# Patient Record
Sex: Male | Born: 1945 | Race: White | Hispanic: No | Marital: Married | Smoking: Former smoker
Health system: Southern US, Community
[De-identification: ages and names within clinical notes are randomized; demographics above are authoritative.]

## PROBLEM LIST (undated history)

## (undated) DIAGNOSIS — I700 Atherosclerosis of aorta: Secondary | ICD-10-CM

## (undated) DIAGNOSIS — Z9889 Other specified postprocedural states: Secondary | ICD-10-CM

## (undated) DIAGNOSIS — G4733 Obstructive sleep apnea (adult) (pediatric): Secondary | ICD-10-CM

## (undated) DIAGNOSIS — E785 Hyperlipidemia, unspecified: Secondary | ICD-10-CM

## (undated) HISTORY — PX: REPLACEMENT TOTAL KNEE: SUR1224

---

## 2011-02-19 ENCOUNTER — Encounter (HOSPITAL_COMMUNITY)
Admission: RE | Admit: 2011-02-19 | Discharge: 2011-02-19 | Disposition: A | Discharge status: Home / Self Care | Payer: Medicare Other | Source: Ambulatory Visit | Attending: Neurosurgery | Admitting: Neurosurgery

## 2011-02-19 DIAGNOSIS — Z01818 Encounter for other preprocedural examination: Secondary | ICD-10-CM | POA: Insufficient documentation

## 2011-02-19 LAB — CBC
Hemoglobin: 14.2 g/dL (ref 13.0–17.0)
MCHC: 34.6 g/dL (ref 30.0–36.0)
RDW: 12.8 % (ref 11.5–15.5)

## 2011-02-19 LAB — SURGICAL PCR SCREEN
MRSA, PCR: NEGATIVE
Staphylococcus aureus: NEGATIVE

## 2011-02-25 ENCOUNTER — Inpatient Hospital Stay (HOSPITAL_COMMUNITY): Admission: RE | Admit: 2011-02-25 | Payer: Medicare Other | Source: Ambulatory Visit | Admitting: Neurosurgery

## 2013-12-06 ENCOUNTER — Ambulatory Visit: Payer: Self-pay | Admitting: Podiatrist

## 2016-10-31 DIAGNOSIS — Z4801 Encounter for change or removal of surgical wound dressing: Secondary | ICD-10-CM | POA: Diagnosis not present

## 2016-10-31 DIAGNOSIS — I83813 Varicose veins of bilateral lower extremities with pain: Secondary | ICD-10-CM | POA: Diagnosis not present

## 2016-10-31 DIAGNOSIS — I83893 Varicose veins of bilateral lower extremities with other complications: Secondary | ICD-10-CM | POA: Diagnosis not present

## 2016-11-14 DIAGNOSIS — I8392 Asymptomatic varicose veins of left lower extremity: Secondary | ICD-10-CM | POA: Diagnosis not present

## 2016-11-14 DIAGNOSIS — I83813 Varicose veins of bilateral lower extremities with pain: Secondary | ICD-10-CM | POA: Diagnosis not present

## 2016-11-14 DIAGNOSIS — Z48812 Encounter for surgical aftercare following surgery on the circulatory system: Secondary | ICD-10-CM | POA: Diagnosis not present

## 2016-11-17 DIAGNOSIS — M26602 Left temporomandibular joint disorder, unspecified: Secondary | ICD-10-CM | POA: Diagnosis not present

## 2016-12-29 DIAGNOSIS — G4733 Obstructive sleep apnea (adult) (pediatric): Secondary | ICD-10-CM | POA: Diagnosis not present

## 2017-01-13 DIAGNOSIS — H26491 Other secondary cataract, right eye: Secondary | ICD-10-CM | POA: Diagnosis not present

## 2017-01-13 DIAGNOSIS — Z8669 Personal history of other diseases of the nervous system and sense organs: Secondary | ICD-10-CM | POA: Diagnosis not present

## 2017-01-13 DIAGNOSIS — H35363 Drusen (degenerative) of macula, bilateral: Secondary | ICD-10-CM | POA: Diagnosis not present

## 2017-01-13 DIAGNOSIS — H04123 Dry eye syndrome of bilateral lacrimal glands: Secondary | ICD-10-CM | POA: Diagnosis not present

## 2017-01-13 DIAGNOSIS — H2512 Age-related nuclear cataract, left eye: Secondary | ICD-10-CM | POA: Diagnosis not present

## 2017-01-13 DIAGNOSIS — H35362 Drusen (degenerative) of macula, left eye: Secondary | ICD-10-CM | POA: Diagnosis not present

## 2017-01-13 DIAGNOSIS — H43812 Vitreous degeneration, left eye: Secondary | ICD-10-CM | POA: Diagnosis not present

## 2017-01-15 DIAGNOSIS — G4733 Obstructive sleep apnea (adult) (pediatric): Secondary | ICD-10-CM | POA: Diagnosis not present

## 2017-02-13 DIAGNOSIS — Z48812 Encounter for surgical aftercare following surgery on the circulatory system: Secondary | ICD-10-CM | POA: Diagnosis not present

## 2017-02-13 DIAGNOSIS — I83813 Varicose veins of bilateral lower extremities with pain: Secondary | ICD-10-CM | POA: Diagnosis not present

## 2017-03-11 DIAGNOSIS — G4733 Obstructive sleep apnea (adult) (pediatric): Secondary | ICD-10-CM | POA: Diagnosis not present

## 2017-04-08 DIAGNOSIS — Z0000 Encounter for general adult medical examination without abnormal findings: Secondary | ICD-10-CM | POA: Diagnosis not present

## 2017-05-28 DIAGNOSIS — R1013 Epigastric pain: Secondary | ICD-10-CM | POA: Diagnosis not present

## 2017-05-28 DIAGNOSIS — K921 Melena: Secondary | ICD-10-CM | POA: Diagnosis not present

## 2017-05-28 DIAGNOSIS — R12 Heartburn: Secondary | ICD-10-CM | POA: Diagnosis not present

## 2017-05-28 DIAGNOSIS — K5900 Constipation, unspecified: Secondary | ICD-10-CM | POA: Diagnosis not present

## 2017-06-05 DIAGNOSIS — R109 Unspecified abdominal pain: Secondary | ICD-10-CM | POA: Diagnosis not present

## 2017-06-05 DIAGNOSIS — R1013 Epigastric pain: Secondary | ICD-10-CM | POA: Diagnosis not present

## 2017-06-05 DIAGNOSIS — K2950 Unspecified chronic gastritis without bleeding: Secondary | ICD-10-CM | POA: Diagnosis not present

## 2017-06-05 DIAGNOSIS — K921 Melena: Secondary | ICD-10-CM | POA: Diagnosis not present

## 2017-06-05 DIAGNOSIS — K219 Gastro-esophageal reflux disease without esophagitis: Secondary | ICD-10-CM | POA: Diagnosis not present

## 2017-06-05 DIAGNOSIS — K449 Diaphragmatic hernia without obstruction or gangrene: Secondary | ICD-10-CM | POA: Diagnosis not present

## 2017-06-05 DIAGNOSIS — R12 Heartburn: Secondary | ICD-10-CM | POA: Diagnosis not present

## 2017-06-15 DIAGNOSIS — Z85828 Personal history of other malignant neoplasm of skin: Secondary | ICD-10-CM | POA: Diagnosis not present

## 2017-06-15 DIAGNOSIS — Z08 Encounter for follow-up examination after completed treatment for malignant neoplasm: Secondary | ICD-10-CM | POA: Diagnosis not present

## 2017-06-15 DIAGNOSIS — L570 Actinic keratosis: Secondary | ICD-10-CM | POA: Diagnosis not present

## 2017-07-30 DIAGNOSIS — K219 Gastro-esophageal reflux disease without esophagitis: Secondary | ICD-10-CM | POA: Diagnosis not present

## 2017-07-30 DIAGNOSIS — K5900 Constipation, unspecified: Secondary | ICD-10-CM | POA: Diagnosis not present

## 2017-07-31 DIAGNOSIS — Z23 Encounter for immunization: Secondary | ICD-10-CM | POA: Diagnosis not present

## 2017-10-08 DIAGNOSIS — F419 Anxiety disorder, unspecified: Secondary | ICD-10-CM | POA: Diagnosis not present

## 2017-10-08 DIAGNOSIS — K219 Gastro-esophageal reflux disease without esophagitis: Secondary | ICD-10-CM | POA: Diagnosis not present

## 2017-10-08 DIAGNOSIS — E782 Mixed hyperlipidemia: Secondary | ICD-10-CM | POA: Diagnosis not present

## 2017-12-28 DIAGNOSIS — M25511 Pain in right shoulder: Secondary | ICD-10-CM | POA: Diagnosis not present

## 2017-12-29 DIAGNOSIS — Z135 Encounter for screening for eye and ear disorders: Secondary | ICD-10-CM | POA: Diagnosis not present

## 2017-12-29 DIAGNOSIS — M25511 Pain in right shoulder: Secondary | ICD-10-CM | POA: Diagnosis not present

## 2017-12-30 DIAGNOSIS — M25511 Pain in right shoulder: Secondary | ICD-10-CM | POA: Diagnosis not present

## 2017-12-30 DIAGNOSIS — M75101 Unspecified rotator cuff tear or rupture of right shoulder, not specified as traumatic: Secondary | ICD-10-CM | POA: Diagnosis not present

## 2018-01-26 DIAGNOSIS — H43812 Vitreous degeneration, left eye: Secondary | ICD-10-CM | POA: Diagnosis not present

## 2018-01-26 DIAGNOSIS — Z880 Allergy status to penicillin: Secondary | ICD-10-CM | POA: Diagnosis not present

## 2018-01-26 DIAGNOSIS — Z09 Encounter for follow-up examination after completed treatment for conditions other than malignant neoplasm: Secondary | ICD-10-CM | POA: Diagnosis not present

## 2018-01-26 DIAGNOSIS — H04123 Dry eye syndrome of bilateral lacrimal glands: Secondary | ICD-10-CM | POA: Diagnosis not present

## 2018-01-26 DIAGNOSIS — Z8669 Personal history of other diseases of the nervous system and sense organs: Secondary | ICD-10-CM | POA: Diagnosis not present

## 2018-01-26 DIAGNOSIS — H35363 Drusen (degenerative) of macula, bilateral: Secondary | ICD-10-CM | POA: Diagnosis not present

## 2018-01-26 DIAGNOSIS — H2512 Age-related nuclear cataract, left eye: Secondary | ICD-10-CM | POA: Diagnosis not present

## 2018-01-26 DIAGNOSIS — H31001 Unspecified chorioretinal scars, right eye: Secondary | ICD-10-CM | POA: Diagnosis not present

## 2018-01-26 DIAGNOSIS — Z961 Presence of intraocular lens: Secondary | ICD-10-CM | POA: Diagnosis not present

## 2018-02-01 DIAGNOSIS — M6281 Muscle weakness (generalized): Secondary | ICD-10-CM | POA: Diagnosis not present

## 2018-02-01 DIAGNOSIS — M25511 Pain in right shoulder: Secondary | ICD-10-CM | POA: Diagnosis not present

## 2018-02-01 DIAGNOSIS — M75101 Unspecified rotator cuff tear or rupture of right shoulder, not specified as traumatic: Secondary | ICD-10-CM | POA: Diagnosis not present

## 2018-02-19 DIAGNOSIS — M75101 Unspecified rotator cuff tear or rupture of right shoulder, not specified as traumatic: Secondary | ICD-10-CM | POA: Diagnosis not present

## 2018-02-19 DIAGNOSIS — M25511 Pain in right shoulder: Secondary | ICD-10-CM | POA: Diagnosis not present

## 2018-02-19 DIAGNOSIS — M6281 Muscle weakness (generalized): Secondary | ICD-10-CM | POA: Diagnosis not present

## 2018-03-10 DIAGNOSIS — M1711 Unilateral primary osteoarthritis, right knee: Secondary | ICD-10-CM | POA: Diagnosis not present

## 2018-03-18 DIAGNOSIS — M1711 Unilateral primary osteoarthritis, right knee: Secondary | ICD-10-CM | POA: Diagnosis not present

## 2018-04-16 DIAGNOSIS — Z0000 Encounter for general adult medical examination without abnormal findings: Secondary | ICD-10-CM | POA: Diagnosis not present

## 2018-04-19 DIAGNOSIS — I493 Ventricular premature depolarization: Secondary | ICD-10-CM | POA: Diagnosis not present

## 2018-04-19 DIAGNOSIS — R55 Syncope and collapse: Secondary | ICD-10-CM | POA: Diagnosis not present

## 2018-04-20 DIAGNOSIS — R7989 Other specified abnormal findings of blood chemistry: Secondary | ICD-10-CM | POA: Diagnosis not present

## 2018-04-20 DIAGNOSIS — R55 Syncope and collapse: Secondary | ICD-10-CM | POA: Diagnosis not present

## 2018-04-21 DIAGNOSIS — G4733 Obstructive sleep apnea (adult) (pediatric): Secondary | ICD-10-CM | POA: Diagnosis not present

## 2018-04-22 DIAGNOSIS — R55 Syncope and collapse: Secondary | ICD-10-CM | POA: Diagnosis not present

## 2018-04-27 DIAGNOSIS — R55 Syncope and collapse: Secondary | ICD-10-CM | POA: Diagnosis not present

## 2018-05-21 DIAGNOSIS — R55 Syncope and collapse: Secondary | ICD-10-CM | POA: Diagnosis not present

## 2018-06-18 DIAGNOSIS — L578 Other skin changes due to chronic exposure to nonionizing radiation: Secondary | ICD-10-CM | POA: Diagnosis not present

## 2018-06-18 DIAGNOSIS — D485 Neoplasm of uncertain behavior of skin: Secondary | ICD-10-CM | POA: Diagnosis not present

## 2018-06-18 DIAGNOSIS — L738 Other specified follicular disorders: Secondary | ICD-10-CM | POA: Diagnosis not present

## 2018-06-18 DIAGNOSIS — L821 Other seborrheic keratosis: Secondary | ICD-10-CM | POA: Diagnosis not present

## 2018-06-18 DIAGNOSIS — L570 Actinic keratosis: Secondary | ICD-10-CM | POA: Diagnosis not present

## 2018-06-18 DIAGNOSIS — Z08 Encounter for follow-up examination after completed treatment for malignant neoplasm: Secondary | ICD-10-CM | POA: Diagnosis not present

## 2018-06-18 DIAGNOSIS — Z85828 Personal history of other malignant neoplasm of skin: Secondary | ICD-10-CM | POA: Diagnosis not present

## 2018-06-18 DIAGNOSIS — C44719 Basal cell carcinoma of skin of left lower limb, including hip: Secondary | ICD-10-CM | POA: Diagnosis not present

## 2018-06-21 DIAGNOSIS — M1711 Unilateral primary osteoarthritis, right knee: Secondary | ICD-10-CM | POA: Diagnosis not present

## 2018-06-21 DIAGNOSIS — M75101 Unspecified rotator cuff tear or rupture of right shoulder, not specified as traumatic: Secondary | ICD-10-CM | POA: Diagnosis not present

## 2018-07-04 DIAGNOSIS — Z23 Encounter for immunization: Secondary | ICD-10-CM | POA: Diagnosis not present

## 2018-07-05 DIAGNOSIS — R55 Syncope and collapse: Secondary | ICD-10-CM | POA: Diagnosis not present

## 2018-08-03 DIAGNOSIS — C44719 Basal cell carcinoma of skin of left lower limb, including hip: Secondary | ICD-10-CM | POA: Diagnosis not present

## 2018-08-18 DIAGNOSIS — F419 Anxiety disorder, unspecified: Secondary | ICD-10-CM | POA: Diagnosis not present

## 2018-08-18 DIAGNOSIS — R972 Elevated prostate specific antigen [PSA]: Secondary | ICD-10-CM | POA: Diagnosis not present

## 2018-08-18 DIAGNOSIS — M75101 Unspecified rotator cuff tear or rupture of right shoulder, not specified as traumatic: Secondary | ICD-10-CM | POA: Diagnosis not present

## 2018-08-18 DIAGNOSIS — G4733 Obstructive sleep apnea (adult) (pediatric): Secondary | ICD-10-CM | POA: Diagnosis not present

## 2018-08-23 DIAGNOSIS — M75101 Unspecified rotator cuff tear or rupture of right shoulder, not specified as traumatic: Secondary | ICD-10-CM | POA: Diagnosis not present

## 2018-08-25 DIAGNOSIS — M75121 Complete rotator cuff tear or rupture of right shoulder, not specified as traumatic: Secondary | ICD-10-CM | POA: Diagnosis not present

## 2018-08-25 DIAGNOSIS — M25511 Pain in right shoulder: Secondary | ICD-10-CM | POA: Diagnosis not present

## 2018-08-26 DIAGNOSIS — M75101 Unspecified rotator cuff tear or rupture of right shoulder, not specified as traumatic: Secondary | ICD-10-CM | POA: Diagnosis not present

## 2018-08-31 DIAGNOSIS — M75101 Unspecified rotator cuff tear or rupture of right shoulder, not specified as traumatic: Secondary | ICD-10-CM | POA: Diagnosis not present

## 2018-09-01 DIAGNOSIS — Z1331 Encounter for screening for depression: Secondary | ICD-10-CM | POA: Diagnosis not present

## 2018-09-01 DIAGNOSIS — Z01818 Encounter for other preprocedural examination: Secondary | ICD-10-CM | POA: Diagnosis not present

## 2018-09-01 DIAGNOSIS — F419 Anxiety disorder, unspecified: Secondary | ICD-10-CM | POA: Diagnosis not present

## 2018-09-01 DIAGNOSIS — Z139 Encounter for screening, unspecified: Secondary | ICD-10-CM | POA: Diagnosis not present

## 2018-09-01 DIAGNOSIS — Z0000 Encounter for general adult medical examination without abnormal findings: Secondary | ICD-10-CM | POA: Diagnosis not present

## 2018-09-09 DIAGNOSIS — Z6826 Body mass index (BMI) 26.0-26.9, adult: Secondary | ICD-10-CM | POA: Diagnosis not present

## 2018-09-09 DIAGNOSIS — Z01818 Encounter for other preprocedural examination: Secondary | ICD-10-CM | POA: Diagnosis not present

## 2018-09-10 DIAGNOSIS — M67813 Other specified disorders of tendon, right shoulder: Secondary | ICD-10-CM | POA: Diagnosis not present

## 2018-09-10 DIAGNOSIS — K219 Gastro-esophageal reflux disease without esophagitis: Secondary | ICD-10-CM | POA: Diagnosis not present

## 2018-09-10 DIAGNOSIS — M24611 Ankylosis, right shoulder: Secondary | ICD-10-CM | POA: Diagnosis not present

## 2018-09-10 DIAGNOSIS — M75101 Unspecified rotator cuff tear or rupture of right shoulder, not specified as traumatic: Secondary | ICD-10-CM | POA: Diagnosis not present

## 2018-09-10 DIAGNOSIS — M7551 Bursitis of right shoulder: Secondary | ICD-10-CM | POA: Diagnosis not present

## 2018-09-10 DIAGNOSIS — M67921 Unspecified disorder of synovium and tendon, right upper arm: Secondary | ICD-10-CM | POA: Diagnosis not present

## 2018-09-10 DIAGNOSIS — E7800 Pure hypercholesterolemia, unspecified: Secondary | ICD-10-CM | POA: Diagnosis not present

## 2018-09-10 DIAGNOSIS — G8918 Other acute postprocedural pain: Secondary | ICD-10-CM | POA: Diagnosis not present

## 2018-09-10 DIAGNOSIS — Z87891 Personal history of nicotine dependence: Secondary | ICD-10-CM | POA: Diagnosis not present

## 2018-09-10 DIAGNOSIS — M1711 Unilateral primary osteoarthritis, right knee: Secondary | ICD-10-CM | POA: Diagnosis not present

## 2018-09-10 DIAGNOSIS — Z79899 Other long term (current) drug therapy: Secondary | ICD-10-CM | POA: Diagnosis not present

## 2018-09-10 DIAGNOSIS — M75121 Complete rotator cuff tear or rupture of right shoulder, not specified as traumatic: Secondary | ICD-10-CM | POA: Diagnosis not present

## 2018-09-10 DIAGNOSIS — M75111 Incomplete rotator cuff tear or rupture of right shoulder, not specified as traumatic: Secondary | ICD-10-CM | POA: Diagnosis not present

## 2018-10-15 DIAGNOSIS — Z4789 Encounter for other orthopedic aftercare: Secondary | ICD-10-CM | POA: Diagnosis not present

## 2018-10-15 DIAGNOSIS — M6281 Muscle weakness (generalized): Secondary | ICD-10-CM | POA: Diagnosis not present

## 2018-10-15 DIAGNOSIS — M25611 Stiffness of right shoulder, not elsewhere classified: Secondary | ICD-10-CM | POA: Diagnosis not present

## 2018-10-18 DIAGNOSIS — M25611 Stiffness of right shoulder, not elsewhere classified: Secondary | ICD-10-CM | POA: Diagnosis not present

## 2018-10-18 DIAGNOSIS — M6281 Muscle weakness (generalized): Secondary | ICD-10-CM | POA: Diagnosis not present

## 2018-10-18 DIAGNOSIS — Z4789 Encounter for other orthopedic aftercare: Secondary | ICD-10-CM | POA: Diagnosis not present

## 2018-10-19 DIAGNOSIS — M6281 Muscle weakness (generalized): Secondary | ICD-10-CM | POA: Diagnosis not present

## 2018-10-19 DIAGNOSIS — Z4789 Encounter for other orthopedic aftercare: Secondary | ICD-10-CM | POA: Diagnosis not present

## 2018-10-19 DIAGNOSIS — M25611 Stiffness of right shoulder, not elsewhere classified: Secondary | ICD-10-CM | POA: Diagnosis not present

## 2018-10-22 DIAGNOSIS — Z4789 Encounter for other orthopedic aftercare: Secondary | ICD-10-CM | POA: Diagnosis not present

## 2018-10-22 DIAGNOSIS — M25611 Stiffness of right shoulder, not elsewhere classified: Secondary | ICD-10-CM | POA: Diagnosis not present

## 2018-10-22 DIAGNOSIS — M6281 Muscle weakness (generalized): Secondary | ICD-10-CM | POA: Diagnosis not present

## 2018-10-25 DIAGNOSIS — Z4789 Encounter for other orthopedic aftercare: Secondary | ICD-10-CM | POA: Diagnosis not present

## 2018-10-25 DIAGNOSIS — M25611 Stiffness of right shoulder, not elsewhere classified: Secondary | ICD-10-CM | POA: Diagnosis not present

## 2018-10-25 DIAGNOSIS — M6281 Muscle weakness (generalized): Secondary | ICD-10-CM | POA: Diagnosis not present

## 2018-10-28 DIAGNOSIS — Z4789 Encounter for other orthopedic aftercare: Secondary | ICD-10-CM | POA: Diagnosis not present

## 2018-10-28 DIAGNOSIS — M6281 Muscle weakness (generalized): Secondary | ICD-10-CM | POA: Diagnosis not present

## 2018-10-28 DIAGNOSIS — M25611 Stiffness of right shoulder, not elsewhere classified: Secondary | ICD-10-CM | POA: Diagnosis not present

## 2018-11-02 DIAGNOSIS — Z4789 Encounter for other orthopedic aftercare: Secondary | ICD-10-CM | POA: Diagnosis not present

## 2018-11-02 DIAGNOSIS — M6281 Muscle weakness (generalized): Secondary | ICD-10-CM | POA: Diagnosis not present

## 2018-11-02 DIAGNOSIS — M25611 Stiffness of right shoulder, not elsewhere classified: Secondary | ICD-10-CM | POA: Diagnosis not present

## 2018-11-05 DIAGNOSIS — M25611 Stiffness of right shoulder, not elsewhere classified: Secondary | ICD-10-CM | POA: Diagnosis not present

## 2018-11-05 DIAGNOSIS — M6281 Muscle weakness (generalized): Secondary | ICD-10-CM | POA: Diagnosis not present

## 2018-11-05 DIAGNOSIS — Z4789 Encounter for other orthopedic aftercare: Secondary | ICD-10-CM | POA: Diagnosis not present

## 2018-11-09 DIAGNOSIS — M6281 Muscle weakness (generalized): Secondary | ICD-10-CM | POA: Diagnosis not present

## 2018-11-09 DIAGNOSIS — Z4789 Encounter for other orthopedic aftercare: Secondary | ICD-10-CM | POA: Diagnosis not present

## 2018-11-09 DIAGNOSIS — M25611 Stiffness of right shoulder, not elsewhere classified: Secondary | ICD-10-CM | POA: Diagnosis not present

## 2018-11-11 DIAGNOSIS — Z4789 Encounter for other orthopedic aftercare: Secondary | ICD-10-CM | POA: Diagnosis not present

## 2018-11-11 DIAGNOSIS — M6281 Muscle weakness (generalized): Secondary | ICD-10-CM | POA: Diagnosis not present

## 2018-11-11 DIAGNOSIS — M25611 Stiffness of right shoulder, not elsewhere classified: Secondary | ICD-10-CM | POA: Diagnosis not present

## 2018-11-16 DIAGNOSIS — M25611 Stiffness of right shoulder, not elsewhere classified: Secondary | ICD-10-CM | POA: Diagnosis not present

## 2018-11-16 DIAGNOSIS — Z4789 Encounter for other orthopedic aftercare: Secondary | ICD-10-CM | POA: Diagnosis not present

## 2018-11-16 DIAGNOSIS — M6281 Muscle weakness (generalized): Secondary | ICD-10-CM | POA: Diagnosis not present

## 2018-11-19 DIAGNOSIS — Z4789 Encounter for other orthopedic aftercare: Secondary | ICD-10-CM | POA: Diagnosis not present

## 2018-11-19 DIAGNOSIS — M6281 Muscle weakness (generalized): Secondary | ICD-10-CM | POA: Diagnosis not present

## 2018-11-19 DIAGNOSIS — M25611 Stiffness of right shoulder, not elsewhere classified: Secondary | ICD-10-CM | POA: Diagnosis not present

## 2018-11-22 DIAGNOSIS — Z4789 Encounter for other orthopedic aftercare: Secondary | ICD-10-CM | POA: Diagnosis not present

## 2018-11-22 DIAGNOSIS — M6281 Muscle weakness (generalized): Secondary | ICD-10-CM | POA: Diagnosis not present

## 2018-11-22 DIAGNOSIS — M25611 Stiffness of right shoulder, not elsewhere classified: Secondary | ICD-10-CM | POA: Diagnosis not present

## 2018-11-24 DIAGNOSIS — M25611 Stiffness of right shoulder, not elsewhere classified: Secondary | ICD-10-CM | POA: Diagnosis not present

## 2018-11-24 DIAGNOSIS — Z4789 Encounter for other orthopedic aftercare: Secondary | ICD-10-CM | POA: Diagnosis not present

## 2018-11-24 DIAGNOSIS — M6281 Muscle weakness (generalized): Secondary | ICD-10-CM | POA: Diagnosis not present

## 2018-11-30 DIAGNOSIS — M6281 Muscle weakness (generalized): Secondary | ICD-10-CM | POA: Diagnosis not present

## 2018-11-30 DIAGNOSIS — M25611 Stiffness of right shoulder, not elsewhere classified: Secondary | ICD-10-CM | POA: Diagnosis not present

## 2018-11-30 DIAGNOSIS — Z4789 Encounter for other orthopedic aftercare: Secondary | ICD-10-CM | POA: Diagnosis not present

## 2018-12-03 DIAGNOSIS — M25611 Stiffness of right shoulder, not elsewhere classified: Secondary | ICD-10-CM | POA: Diagnosis not present

## 2018-12-03 DIAGNOSIS — Z4789 Encounter for other orthopedic aftercare: Secondary | ICD-10-CM | POA: Diagnosis not present

## 2018-12-03 DIAGNOSIS — M6281 Muscle weakness (generalized): Secondary | ICD-10-CM | POA: Diagnosis not present

## 2018-12-08 DIAGNOSIS — M25611 Stiffness of right shoulder, not elsewhere classified: Secondary | ICD-10-CM | POA: Diagnosis not present

## 2018-12-08 DIAGNOSIS — Z4789 Encounter for other orthopedic aftercare: Secondary | ICD-10-CM | POA: Diagnosis not present

## 2018-12-08 DIAGNOSIS — M6281 Muscle weakness (generalized): Secondary | ICD-10-CM | POA: Diagnosis not present

## 2018-12-15 DIAGNOSIS — Z4789 Encounter for other orthopedic aftercare: Secondary | ICD-10-CM | POA: Diagnosis not present

## 2018-12-15 DIAGNOSIS — M6281 Muscle weakness (generalized): Secondary | ICD-10-CM | POA: Diagnosis not present

## 2018-12-15 DIAGNOSIS — M25611 Stiffness of right shoulder, not elsewhere classified: Secondary | ICD-10-CM | POA: Diagnosis not present

## 2018-12-22 DIAGNOSIS — M6281 Muscle weakness (generalized): Secondary | ICD-10-CM | POA: Diagnosis not present

## 2018-12-22 DIAGNOSIS — M25611 Stiffness of right shoulder, not elsewhere classified: Secondary | ICD-10-CM | POA: Diagnosis not present

## 2018-12-22 DIAGNOSIS — Z4789 Encounter for other orthopedic aftercare: Secondary | ICD-10-CM | POA: Diagnosis not present

## 2018-12-23 DIAGNOSIS — K219 Gastro-esophageal reflux disease without esophagitis: Secondary | ICD-10-CM | POA: Diagnosis not present

## 2018-12-23 DIAGNOSIS — Z6826 Body mass index (BMI) 26.0-26.9, adult: Secondary | ICD-10-CM | POA: Diagnosis not present

## 2018-12-23 DIAGNOSIS — M75101 Unspecified rotator cuff tear or rupture of right shoulder, not specified as traumatic: Secondary | ICD-10-CM | POA: Diagnosis not present

## 2018-12-23 DIAGNOSIS — F419 Anxiety disorder, unspecified: Secondary | ICD-10-CM | POA: Diagnosis not present

## 2019-01-13 DIAGNOSIS — H35351 Cystoid macular degeneration, right eye: Secondary | ICD-10-CM | POA: Diagnosis not present

## 2019-01-13 DIAGNOSIS — Z8669 Personal history of other diseases of the nervous system and sense organs: Secondary | ICD-10-CM | POA: Diagnosis not present

## 2019-02-03 ENCOUNTER — Encounter: Payer: Self-pay | Admitting: *Deleted

## 2019-03-01 DIAGNOSIS — H35363 Drusen (degenerative) of macula, bilateral: Secondary | ICD-10-CM | POA: Diagnosis not present

## 2019-03-01 DIAGNOSIS — H2512 Age-related nuclear cataract, left eye: Secondary | ICD-10-CM | POA: Diagnosis not present

## 2019-03-01 DIAGNOSIS — Z8669 Personal history of other diseases of the nervous system and sense organs: Secondary | ICD-10-CM | POA: Diagnosis not present

## 2019-03-01 DIAGNOSIS — H04123 Dry eye syndrome of bilateral lacrimal glands: Secondary | ICD-10-CM | POA: Diagnosis not present

## 2019-03-01 DIAGNOSIS — H35351 Cystoid macular degeneration, right eye: Secondary | ICD-10-CM | POA: Diagnosis not present

## 2019-03-15 ENCOUNTER — Other Ambulatory Visit: Payer: Self-pay | Admitting: *Deleted

## 2019-03-15 NOTE — Patient Outreach (Signed)
[facility]) Care Management  03/15/2019  Lawrence Gomez [birth year] [id number]   HTA HRA Telephone Screen     Outreach Attempt:  Successful telephone outreach to patient in the month of April to complete Health Risk Assessment Screening.  HIPAA verified with patient.  Patient reporting no significant medical history.  Has advance directive in place.  Denies any needs, at this time.   Plan:  Patient will be placed in a [place] with 6 month follow up.  RN Health Coach will send [facility] Welcome Letter.  RN Health Coach will make next telephone outreach to patient for follow up in the month of October.  [place] [phone] [email or web address]

## 2019-04-05 DIAGNOSIS — H30031 Focal chorioretinal inflammation, peripheral, right eye: Secondary | ICD-10-CM | POA: Diagnosis not present

## 2019-04-05 DIAGNOSIS — H35363 Drusen (degenerative) of macula, bilateral: Secondary | ICD-10-CM | POA: Diagnosis not present

## 2019-04-05 DIAGNOSIS — H04123 Dry eye syndrome of bilateral lacrimal glands: Secondary | ICD-10-CM | POA: Diagnosis not present

## 2019-04-05 DIAGNOSIS — H2512 Age-related nuclear cataract, left eye: Secondary | ICD-10-CM | POA: Diagnosis not present

## 2019-04-05 DIAGNOSIS — H35351 Cystoid macular degeneration, right eye: Secondary | ICD-10-CM | POA: Diagnosis not present

## 2019-04-05 DIAGNOSIS — Z8669 Personal history of other diseases of the nervous system and sense organs: Secondary | ICD-10-CM | POA: Diagnosis not present

## 2019-04-07 DIAGNOSIS — M25511 Pain in right shoulder: Secondary | ICD-10-CM | POA: Diagnosis not present

## 2019-04-12 DIAGNOSIS — H35351 Cystoid macular degeneration, right eye: Secondary | ICD-10-CM | POA: Diagnosis not present

## 2019-04-20 ENCOUNTER — Other Ambulatory Visit: Payer: Self-pay | Admitting: *Deleted

## 2019-04-20 NOTE — Patient Outreach (Signed)
[facility]) Care Management  04/20/2019  Lawrence Gomez 1946/02/19 [id number]   Case Closure/Transition to [facility]/CCI   Outreach:  Patient case has been transitioned to [facility] Health/CCI for further Care Management assistance.  Plan: RN Health Coach will close case at this time. RN Health Coach will send primary care provider Care Management Case Closure Letter. RN Health Coach will make patient inactive with [facility] Care Management at this time.  [place] [phone] [email or web address]

## 2019-04-26 DIAGNOSIS — Z20828 Contact with and (suspected) exposure to other viral communicable diseases: Secondary | ICD-10-CM | POA: Diagnosis not present

## 2019-05-04 DIAGNOSIS — G4733 Obstructive sleep apnea (adult) (pediatric): Secondary | ICD-10-CM | POA: Diagnosis not present

## 2019-05-17 DIAGNOSIS — H35351 Cystoid macular degeneration, right eye: Secondary | ICD-10-CM | POA: Diagnosis not present

## 2019-05-17 DIAGNOSIS — Z8669 Personal history of other diseases of the nervous system and sense organs: Secondary | ICD-10-CM | POA: Diagnosis not present

## 2019-05-17 DIAGNOSIS — H35063 Retinal vasculitis, bilateral: Secondary | ICD-10-CM | POA: Diagnosis not present

## 2019-05-17 DIAGNOSIS — H35363 Drusen (degenerative) of macula, bilateral: Secondary | ICD-10-CM | POA: Diagnosis not present

## 2019-05-17 DIAGNOSIS — H04123 Dry eye syndrome of bilateral lacrimal glands: Secondary | ICD-10-CM | POA: Diagnosis not present

## 2019-05-17 DIAGNOSIS — H2512 Age-related nuclear cataract, left eye: Secondary | ICD-10-CM | POA: Diagnosis not present

## 2019-05-17 DIAGNOSIS — H30031 Focal chorioretinal inflammation, peripheral, right eye: Secondary | ICD-10-CM | POA: Diagnosis not present

## 2019-05-23 DIAGNOSIS — Z6826 Body mass index (BMI) 26.0-26.9, adult: Secondary | ICD-10-CM | POA: Diagnosis not present

## 2019-05-23 DIAGNOSIS — H6122 Impacted cerumen, left ear: Secondary | ICD-10-CM | POA: Diagnosis not present

## 2019-05-23 DIAGNOSIS — H61892 Other specified disorders of left external ear: Secondary | ICD-10-CM | POA: Diagnosis not present

## 2019-05-23 DIAGNOSIS — H9202 Otalgia, left ear: Secondary | ICD-10-CM | POA: Diagnosis not present

## 2019-05-24 DIAGNOSIS — J343 Hypertrophy of nasal turbinates: Secondary | ICD-10-CM | POA: Diagnosis not present

## 2019-05-24 DIAGNOSIS — H9202 Otalgia, left ear: Secondary | ICD-10-CM | POA: Diagnosis not present

## 2019-05-24 DIAGNOSIS — H9012 Conductive hearing loss, unilateral, left ear, with unrestricted hearing on the contralateral side: Secondary | ICD-10-CM | POA: Diagnosis not present

## 2019-05-24 DIAGNOSIS — H6122 Impacted cerumen, left ear: Secondary | ICD-10-CM | POA: Diagnosis not present

## 2019-06-15 DIAGNOSIS — Z20828 Contact with and (suspected) exposure to other viral communicable diseases: Secondary | ICD-10-CM | POA: Diagnosis not present

## 2019-07-19 DIAGNOSIS — H30031 Focal chorioretinal inflammation, peripheral, right eye: Secondary | ICD-10-CM | POA: Diagnosis not present

## 2019-07-19 DIAGNOSIS — H35351 Cystoid macular degeneration, right eye: Secondary | ICD-10-CM | POA: Diagnosis not present

## 2019-08-02 ENCOUNTER — Ambulatory Visit: Payer: No Typology Code available for payment source | Admitting: *Deleted

## 2019-08-09 DIAGNOSIS — L905 Scar conditions and fibrosis of skin: Secondary | ICD-10-CM | POA: Diagnosis not present

## 2019-08-09 DIAGNOSIS — Z08 Encounter for follow-up examination after completed treatment for malignant neoplasm: Secondary | ICD-10-CM | POA: Diagnosis not present

## 2019-08-09 DIAGNOSIS — L578 Other skin changes due to chronic exposure to nonionizing radiation: Secondary | ICD-10-CM | POA: Diagnosis not present

## 2019-08-09 DIAGNOSIS — D1801 Hemangioma of skin and subcutaneous tissue: Secondary | ICD-10-CM | POA: Diagnosis not present

## 2019-08-09 DIAGNOSIS — D692 Other nonthrombocytopenic purpura: Secondary | ICD-10-CM | POA: Diagnosis not present

## 2019-08-09 DIAGNOSIS — Z85828 Personal history of other malignant neoplasm of skin: Secondary | ICD-10-CM | POA: Diagnosis not present

## 2019-08-09 DIAGNOSIS — L570 Actinic keratosis: Secondary | ICD-10-CM | POA: Diagnosis not present

## 2019-08-09 DIAGNOSIS — L821 Other seborrheic keratosis: Secondary | ICD-10-CM | POA: Diagnosis not present

## 2019-09-16 DIAGNOSIS — H35351 Cystoid macular degeneration, right eye: Secondary | ICD-10-CM | POA: Diagnosis not present

## 2019-09-16 DIAGNOSIS — Z79899 Other long term (current) drug therapy: Secondary | ICD-10-CM | POA: Diagnosis not present

## 2019-09-16 DIAGNOSIS — H30031 Focal chorioretinal inflammation, peripheral, right eye: Secondary | ICD-10-CM | POA: Diagnosis not present

## 2019-09-16 DIAGNOSIS — H3581 Retinal edema: Secondary | ICD-10-CM | POA: Diagnosis not present

## 2019-09-16 DIAGNOSIS — H2512 Age-related nuclear cataract, left eye: Secondary | ICD-10-CM | POA: Diagnosis not present

## 2019-09-16 DIAGNOSIS — H04123 Dry eye syndrome of bilateral lacrimal glands: Secondary | ICD-10-CM | POA: Diagnosis not present

## 2019-09-16 DIAGNOSIS — H35363 Drusen (degenerative) of macula, bilateral: Secondary | ICD-10-CM | POA: Diagnosis not present

## 2019-09-16 DIAGNOSIS — Z8669 Personal history of other diseases of the nervous system and sense organs: Secondary | ICD-10-CM | POA: Diagnosis not present

## 2019-09-16 DIAGNOSIS — H35063 Retinal vasculitis, bilateral: Secondary | ICD-10-CM | POA: Diagnosis not present

## 2019-09-28 DIAGNOSIS — Z961 Presence of intraocular lens: Secondary | ICD-10-CM | POA: Diagnosis not present

## 2019-09-28 DIAGNOSIS — H2512 Age-related nuclear cataract, left eye: Secondary | ICD-10-CM | POA: Diagnosis not present

## 2019-11-16 DIAGNOSIS — Z20822 Contact with and (suspected) exposure to covid-19: Secondary | ICD-10-CM | POA: Diagnosis not present

## 2019-11-16 DIAGNOSIS — H2512 Age-related nuclear cataract, left eye: Secondary | ICD-10-CM | POA: Diagnosis not present

## 2019-11-16 DIAGNOSIS — H25812 Combined forms of age-related cataract, left eye: Secondary | ICD-10-CM | POA: Diagnosis not present

## 2019-11-16 DIAGNOSIS — Z01812 Encounter for preprocedural laboratory examination: Secondary | ICD-10-CM | POA: Diagnosis not present

## 2019-11-17 DIAGNOSIS — Z01818 Encounter for other preprocedural examination: Secondary | ICD-10-CM | POA: Diagnosis not present

## 2019-11-17 DIAGNOSIS — H2512 Age-related nuclear cataract, left eye: Secondary | ICD-10-CM | POA: Diagnosis not present

## 2019-11-22 DIAGNOSIS — G4730 Sleep apnea, unspecified: Secondary | ICD-10-CM | POA: Diagnosis not present

## 2019-11-22 DIAGNOSIS — Z87891 Personal history of nicotine dependence: Secondary | ICD-10-CM | POA: Diagnosis not present

## 2019-11-22 DIAGNOSIS — H25812 Combined forms of age-related cataract, left eye: Secondary | ICD-10-CM | POA: Diagnosis not present

## 2019-11-23 DIAGNOSIS — H30031 Focal chorioretinal inflammation, peripheral, right eye: Secondary | ICD-10-CM | POA: Diagnosis not present

## 2019-11-23 DIAGNOSIS — Z48810 Encounter for surgical aftercare following surgery on the sense organs: Secondary | ICD-10-CM | POA: Diagnosis not present

## 2019-11-23 DIAGNOSIS — Z961 Presence of intraocular lens: Secondary | ICD-10-CM | POA: Diagnosis not present

## 2019-11-23 DIAGNOSIS — Z8669 Personal history of other diseases of the nervous system and sense organs: Secondary | ICD-10-CM | POA: Diagnosis not present

## 2019-12-13 DIAGNOSIS — Z961 Presence of intraocular lens: Secondary | ICD-10-CM | POA: Diagnosis not present

## 2019-12-13 DIAGNOSIS — H35063 Retinal vasculitis, bilateral: Secondary | ICD-10-CM | POA: Diagnosis not present

## 2019-12-13 DIAGNOSIS — H04123 Dry eye syndrome of bilateral lacrimal glands: Secondary | ICD-10-CM | POA: Diagnosis not present

## 2019-12-13 DIAGNOSIS — H35363 Drusen (degenerative) of macula, bilateral: Secondary | ICD-10-CM | POA: Diagnosis not present

## 2019-12-13 DIAGNOSIS — H35351 Cystoid macular degeneration, right eye: Secondary | ICD-10-CM | POA: Diagnosis not present

## 2019-12-13 DIAGNOSIS — Z79899 Other long term (current) drug therapy: Secondary | ICD-10-CM | POA: Diagnosis not present

## 2019-12-13 DIAGNOSIS — Z8669 Personal history of other diseases of the nervous system and sense organs: Secondary | ICD-10-CM | POA: Diagnosis not present

## 2019-12-13 DIAGNOSIS — H30031 Focal chorioretinal inflammation, peripheral, right eye: Secondary | ICD-10-CM | POA: Diagnosis not present

## 2019-12-16 DIAGNOSIS — H30031 Focal chorioretinal inflammation, peripheral, right eye: Secondary | ICD-10-CM | POA: Diagnosis not present

## 2019-12-16 DIAGNOSIS — Z79899 Other long term (current) drug therapy: Secondary | ICD-10-CM | POA: Diagnosis not present

## 2019-12-29 DIAGNOSIS — H35351 Cystoid macular degeneration, right eye: Secondary | ICD-10-CM | POA: Diagnosis not present

## 2019-12-29 DIAGNOSIS — F419 Anxiety disorder, unspecified: Secondary | ICD-10-CM | POA: Diagnosis not present

## 2019-12-29 DIAGNOSIS — Z0000 Encounter for general adult medical examination without abnormal findings: Secondary | ICD-10-CM | POA: Diagnosis not present

## 2019-12-29 DIAGNOSIS — Z1331 Encounter for screening for depression: Secondary | ICD-10-CM | POA: Diagnosis not present

## 2019-12-29 DIAGNOSIS — Z1339 Encounter for screening examination for other mental health and behavioral disorders: Secondary | ICD-10-CM | POA: Diagnosis not present

## 2019-12-29 DIAGNOSIS — Z7189 Other specified counseling: Secondary | ICD-10-CM | POA: Diagnosis not present

## 2019-12-29 DIAGNOSIS — E785 Hyperlipidemia, unspecified: Secondary | ICD-10-CM | POA: Diagnosis not present

## 2019-12-29 DIAGNOSIS — R768 Other specified abnormal immunological findings in serum: Secondary | ICD-10-CM | POA: Diagnosis not present

## 2019-12-29 DIAGNOSIS — Z139 Encounter for screening, unspecified: Secondary | ICD-10-CM | POA: Diagnosis not present

## 2019-12-29 DIAGNOSIS — Z136 Encounter for screening for cardiovascular disorders: Secondary | ICD-10-CM | POA: Diagnosis not present

## 2020-01-25 DIAGNOSIS — H35351 Cystoid macular degeneration, right eye: Secondary | ICD-10-CM | POA: Diagnosis not present

## 2020-01-25 DIAGNOSIS — H35071 Retinal telangiectasis, right eye: Secondary | ICD-10-CM | POA: Diagnosis not present

## 2020-01-25 DIAGNOSIS — G4730 Sleep apnea, unspecified: Secondary | ICD-10-CM | POA: Diagnosis not present

## 2020-01-25 DIAGNOSIS — G4733 Obstructive sleep apnea (adult) (pediatric): Secondary | ICD-10-CM | POA: Diagnosis not present

## 2020-02-03 ENCOUNTER — Encounter (INDEPENDENT_AMBULATORY_CARE_PROVIDER_SITE_OTHER): Payer: Self-pay | Admitting: Ophthalmology

## 2020-02-08 DIAGNOSIS — M1711 Unilateral primary osteoarthritis, right knee: Secondary | ICD-10-CM | POA: Diagnosis not present

## 2020-02-09 ENCOUNTER — Other Ambulatory Visit: Payer: Self-pay

## 2020-02-09 ENCOUNTER — Ambulatory Visit (INDEPENDENT_AMBULATORY_CARE_PROVIDER_SITE_OTHER): Payer: PPO | Admitting: Ophthalmology

## 2020-02-09 DIAGNOSIS — H35071 Retinal telangiectasis, right eye: Secondary | ICD-10-CM | POA: Diagnosis not present

## 2020-02-09 DIAGNOSIS — H35371 Puckering of macula, right eye: Secondary | ICD-10-CM | POA: Diagnosis not present

## 2020-02-09 DIAGNOSIS — H35351 Cystoid macular degeneration, right eye: Secondary | ICD-10-CM

## 2020-02-09 NOTE — Assessment & Plan Note (Signed)
Macular telangiectasis (MAC-TEL), or parafoveal telangiectasis is a condition of "unknown" cause.  Findings in or near the macula (center of vision) consist of microaneurysms (leaking small capillaries), often with leakage of fluid which in the active phase can impact fine discriminatory vision, and in some cases trigger profound scarring in the macula, with severe permanent vision loss.  Standard treatment is observation and periodic examinations to monitor for treatable complications.   The cause  of this condition is "unknown".  However, the practice of Dr. Aerial Dilley has discovered an association with sleep apnea with its nightly periods of low oxygen in the blood stream (hypoxia), retained carbon dioxide (hypercapnia), associated with transient nocturnal hypertensive episodes.   More recently, some patients also been found to have advanced lung disease, whether asthma or COPD, with similar findings.  Dr. Ariba Lehnen has been evaluating the association of sleep apnea, nightly hypoxia, and Macular telangiectasis for over 18 years.  Most patients are found to be noncompliant with sleep apnea therapy or testing in the past.  Resumption of CPAP or similar therapy is strongly recommended if ordered in the past.  Upon review of risk factors or findings positive for sleep apnea, more formal, extensive sleep laboratory or home testing, may be recommended.  Numerous patients, proven to have MAC-TEL, have improved or resolved their ey condition promptly, within weeks, of the use of nighttime oxygen supplementation or continuous positive airway pressure (CPAP). 

## 2020-02-09 NOTE — Progress Notes (Signed)
02/09/2020     CHIEF COMPLAINT Patient presents for Retina Follow Up   HISTORY OF PRESENT ILLNESS: Lawrence Gomez is a 74 y.o. male who presents to the clinic today for:   HPI    Retina Follow Up    Patient presents with  Other.  In both eyes.  Duration of 2 weeks.  Since onset it is stable.          Comments    2 week follow up - OCT OU Patient denies change in vision and overall has no complaints.  Over the last 2 weeks, the patient has resumed careful and nightly use of CPAP.  He reports 7 hours on average sleep nightly.  He reports benefits of increased energy, less somnolence during the day, less napping, and awakens only once nightly as compared to 2 or 3 in the past.  Overall patient is is happy to be using CPAP again.  He does not report any change in quality of vision to date.         Last edited by Hurman Horn, MD on 02/09/2020 12:33 PM. (History)      Referring physician: Marco Collie, MD [address] [place],  [place] [address]  HISTORICAL INFORMATION:   Selected notes from the [place]: No current outpatient medications on file. (Ophthalmic Drugs)   No current facility-administered medications for this visit. (Ophthalmic Drugs)   No current outpatient medications on file. (Other)   No current facility-administered medications for this visit. (Other)      REVIEW OF SYSTEMS:    ALLERGIES Not on File  PAST MEDICAL HISTORY No past medical history on file.  HISTORY Of sleep apnea.   FAMILY HISTORY No family history on file.  SOCIAL HISTORY Social History   Tobacco Use  . Smoking status: Not on file  Substance Use Topics  . Alcohol use: Not on file  . Drug use: Not on file         OPHTHALMIC EXAM:  Base Eye Exam    Visual Acuity (Snellen - Linear)      Right Left   Dist [place] 20/80+2 20/30-1   Dist ph [place] 20/40-1 NI       Tonometry (Tonopen, 11:23 AM)      Right Left    Pressure 15 13       Pupils      Pupils Dark Light Shape React APD   Right PERRL 6 5 Round Slow None   Left PERRL 6 5 Round Slow None       Visual Fields (Counting fingers)      Left Right    Full Full       Extraocular Movement      Right Left    Full Full       Neuro/Psych    Oriented x3: Yes   Mood/Affect: Normal       Dilation    Both eyes: 1.0% Mydriacyl, 2.5% Phenylephrine @ 11:23 AM        Slit Lamp and Fundus Exam    External Exam      Right Left   External Normal Normal       Slit Lamp Exam      Right Left   Lids/Lashes Normal Normal   Conjunctiva/Sclera White and quiet White and quiet   Cornea Clear Clear   Anterior Chamber Deep and quiet Deep and quiet  Iris Round and reactive Round and reactive   Lens Posterior chamber intraocular lens Posterior chamber intraocular lens   Vitreous Normal Normal          IMAGING AND PROCEDURES  Imaging and Procedures for 02/09/20  OCT, Retina - OU - Both Eyes       Right Eye Quality was good. Scan locations included subfoveal. Central Foveal Thickness: 319. Progression has improved. Findings include abnormal foveal contour, epiretinal membrane, cystoid macular edema.   Left Eye Quality was good. Scan locations included subfoveal. Central Foveal Thickness: 296. Progression has been stable. Findings include normal observations.   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  Findings in or near the macula (center of vision) consist of microaneurysms (leaking  small capillaries), often with leakage of fluid which in the active phase can impact fine discriminatory vision, and in some cases trigger profound scarring in the macula, with severe permanent vision loss.  Standard treatment is observation and periodic examinations to monitor for treatable complications.   The cause  of this condition is "unknown".  However, the practice of Dr. Zadie Rhine has discovered an association with sleep apnea with its nightly periods of low oxygen in the blood stream (hypoxia), retained carbon dioxide (hypercapnia), associated with transient nocturnal hypertensive episodes.   More recently, some patients also been found to have advanced lung disease, whether asthma or COPD, with similar findings.  Dr. Zadie Rhine has been evaluating the association of sleep apnea, nightly hypoxia, and Macular telangiectasis for over 18 years.  Most patients are found to be noncompliant with sleep apnea therapy or testing in the past.  Resumption of CPAP or similar therapy is strongly recommended if ordered in the past.  Upon review of risk factors or findings positive for sleep apnea, more formal, extensive sleep laboratory or home testing, may be recommended.  Numerous patients, proven to have MAC-TEL, have improved or resolved their ey condition promptly, within weeks, of the use of nighttime oxygen supplementation or continuous positive airway pressure (CPAP).         ICD-10-CM   1. 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 Right epiretinal membrane  H35.371     1.OD, much less CME only 2 weeks after resumption of nightly CPAP use with no other changes in his medical ocular care  Epiretinal membrane remains unchanged although the visual acuity is improved in the conjunction with much less cystoid macular edema demonstrated to the patient and his family in the exam room  2.   continued  ongoing CPAP use will assist this condition in his right eye.  3.  Ophthalmic Meds Ordered this visit:  No orders of the defined types were placed in this encounter.      Return in about 6 weeks (around 03/22/2020) for OCT no dilation.  There are no Patient Instructions on file for this visit.   Explained  the diagnoses, plan, and follow up with the patient and they expressed understanding.  Patient expressed understanding of the importance of proper follow up care.   Clent Demark Brenleigh Collet M.D. Diseases & Surgery of the Retina and Vitreous Retina & Diabetic [place] 02/09/20     Abbreviations: M myopia (nearsighted); A astigmatism; H hyperopia (farsighted); P presbyopia; Mrx spectacle prescription;  CTL contact lenses; OD right eye; OS left eye; OU both eyes  XT exotropia; ET esotropia; PEK punctate epithelial keratitis; PEE punctate epithelial erosions; DES dry eye syndrome; MGD meibomian gland dysfunction; ATs artificial tears; PFAT's preservative free artificial tears; [place] nuclear sclerotic cataract; PSC posterior subcapsular cataract; ERM epi-retinal membrane; PVD posterior vitreous detachment; RD retinal detachment; DM diabetes mellitus; DR diabetic retinopathy; NPDR non-proliferative diabetic retinopathy; PDR proliferative diabetic retinopathy; CSME clinically significant macular edema; DME diabetic macular edema; dbh dot blot hemorrhages; CWS cotton wool spot; POAG primary open angle glaucoma; C/D cup-to-disc ratio; HVF humphrey visual field; GVF goldmann visual field; OCT optical coherence tomography; IOP intraocular pressure; BRVO Branch retinal vein occlusion; CRVO central retinal vein occlusion; CRAO central retinal artery occlusion; BRAO branch retinal artery occlusion; RT retinal tear; SB scleral buckle; PPV pars plana vitrectomy; VH Vitreous hemorrhage; PRP panretinal laser photocoagulation; IVK intravitreal kenalog; VMT vitreomacular traction; MH Macular hole;  NVD  neovascularization of the disc; NVE neovascularization elsewhere; AREDS age related eye disease study; ARMD age related macular degeneration; POAG primary open angle glaucoma; EBMD epithelial/anterior basement membrane dystrophy; ACIOL anterior chamber intraocular lens; IOL intraocular lens; PCIOL posterior chamber intraocular lens; Phaco/IOL phacoemulsification with intraocular lens placement; [place] photorefractive keratectomy; LASIK laser assisted in situ keratomileusis; HTN hypertension; DM diabetes mellitus; COPD chronic obstructive pulmonary disease

## 2020-02-21 DIAGNOSIS — H30031 Focal chorioretinal inflammation, peripheral, right eye: Secondary | ICD-10-CM | POA: Diagnosis not present

## 2020-02-21 DIAGNOSIS — H35363 Drusen (degenerative) of macula, bilateral: Secondary | ICD-10-CM | POA: Diagnosis not present

## 2020-02-21 DIAGNOSIS — Z961 Presence of intraocular lens: Secondary | ICD-10-CM | POA: Diagnosis not present

## 2020-02-21 DIAGNOSIS — Z8669 Personal history of other diseases of the nervous system and sense organs: Secondary | ICD-10-CM | POA: Diagnosis not present

## 2020-02-21 DIAGNOSIS — Z79899 Other long term (current) drug therapy: Secondary | ICD-10-CM | POA: Diagnosis not present

## 2020-02-21 DIAGNOSIS — H35351 Cystoid macular degeneration, right eye: Secondary | ICD-10-CM | POA: Diagnosis not present

## 2020-02-21 DIAGNOSIS — H04123 Dry eye syndrome of bilateral lacrimal glands: Secondary | ICD-10-CM | POA: Diagnosis not present

## 2020-02-21 DIAGNOSIS — H35063 Retinal vasculitis, bilateral: Secondary | ICD-10-CM | POA: Diagnosis not present

## 2020-02-22 DIAGNOSIS — H30031 Focal chorioretinal inflammation, peripheral, right eye: Secondary | ICD-10-CM | POA: Diagnosis not present

## 2020-02-22 DIAGNOSIS — Z79899 Other long term (current) drug therapy: Secondary | ICD-10-CM | POA: Diagnosis not present

## 2020-03-15 DIAGNOSIS — C44529 Squamous cell carcinoma of skin of other part of trunk: Secondary | ICD-10-CM | POA: Diagnosis not present

## 2020-03-20 ENCOUNTER — Encounter (INDEPENDENT_AMBULATORY_CARE_PROVIDER_SITE_OTHER): Payer: PPO | Admitting: Ophthalmology

## 2020-03-23 DIAGNOSIS — H26492 Other secondary cataract, left eye: Secondary | ICD-10-CM | POA: Diagnosis not present

## 2020-03-29 ENCOUNTER — Encounter (INDEPENDENT_AMBULATORY_CARE_PROVIDER_SITE_OTHER): Payer: Self-pay | Admitting: Ophthalmology

## 2020-04-06 DIAGNOSIS — M79645 Pain in left finger(s): Secondary | ICD-10-CM | POA: Diagnosis not present

## 2020-05-07 DIAGNOSIS — G4733 Obstructive sleep apnea (adult) (pediatric): Secondary | ICD-10-CM | POA: Diagnosis not present

## 2020-05-15 DIAGNOSIS — H30031 Focal chorioretinal inflammation, peripheral, right eye: Secondary | ICD-10-CM | POA: Diagnosis not present

## 2020-05-15 DIAGNOSIS — H04123 Dry eye syndrome of bilateral lacrimal glands: Secondary | ICD-10-CM | POA: Diagnosis not present

## 2020-05-15 DIAGNOSIS — Z8669 Personal history of other diseases of the nervous system and sense organs: Secondary | ICD-10-CM | POA: Diagnosis not present

## 2020-05-15 DIAGNOSIS — H35363 Drusen (degenerative) of macula, bilateral: Secondary | ICD-10-CM | POA: Diagnosis not present

## 2020-05-15 DIAGNOSIS — Z961 Presence of intraocular lens: Secondary | ICD-10-CM | POA: Diagnosis not present

## 2020-05-15 DIAGNOSIS — Z79899 Other long term (current) drug therapy: Secondary | ICD-10-CM | POA: Diagnosis not present

## 2020-05-15 DIAGNOSIS — H35063 Retinal vasculitis, bilateral: Secondary | ICD-10-CM | POA: Diagnosis not present

## 2020-05-15 DIAGNOSIS — H35351 Cystoid macular degeneration, right eye: Secondary | ICD-10-CM | POA: Diagnosis not present

## 2020-05-23 DIAGNOSIS — H30031 Focal chorioretinal inflammation, peripheral, right eye: Secondary | ICD-10-CM | POA: Diagnosis not present

## 2020-05-23 DIAGNOSIS — Z79899 Other long term (current) drug therapy: Secondary | ICD-10-CM | POA: Diagnosis not present

## 2020-05-24 DIAGNOSIS — M1711 Unilateral primary osteoarthritis, right knee: Secondary | ICD-10-CM | POA: Diagnosis not present

## 2020-06-28 DIAGNOSIS — D539 Nutritional anemia, unspecified: Secondary | ICD-10-CM | POA: Diagnosis not present

## 2020-06-28 DIAGNOSIS — R1013 Epigastric pain: Secondary | ICD-10-CM | POA: Diagnosis not present

## 2020-06-28 DIAGNOSIS — Z86010 Personal history of colonic polyps: Secondary | ICD-10-CM | POA: Diagnosis not present

## 2020-06-28 DIAGNOSIS — Z1211 Encounter for screening for malignant neoplasm of colon: Secondary | ICD-10-CM | POA: Diagnosis not present

## 2020-07-05 DIAGNOSIS — R109 Unspecified abdominal pain: Secondary | ICD-10-CM | POA: Diagnosis not present

## 2020-07-05 DIAGNOSIS — Z79899 Other long term (current) drug therapy: Secondary | ICD-10-CM | POA: Diagnosis not present

## 2020-07-05 DIAGNOSIS — Z5181 Encounter for therapeutic drug level monitoring: Secondary | ICD-10-CM | POA: Diagnosis not present

## 2020-07-11 DIAGNOSIS — R109 Unspecified abdominal pain: Secondary | ICD-10-CM | POA: Diagnosis not present

## 2020-07-12 DIAGNOSIS — D649 Anemia, unspecified: Secondary | ICD-10-CM | POA: Diagnosis not present

## 2020-07-12 DIAGNOSIS — K7689 Other specified diseases of liver: Secondary | ICD-10-CM | POA: Diagnosis not present

## 2020-07-19 DIAGNOSIS — D649 Anemia, unspecified: Secondary | ICD-10-CM | POA: Diagnosis not present

## 2020-07-25 DIAGNOSIS — H35351 Cystoid macular degeneration, right eye: Secondary | ICD-10-CM | POA: Diagnosis not present

## 2020-07-25 DIAGNOSIS — Z961 Presence of intraocular lens: Secondary | ICD-10-CM | POA: Diagnosis not present

## 2020-07-25 DIAGNOSIS — H35363 Drusen (degenerative) of macula, bilateral: Secondary | ICD-10-CM | POA: Diagnosis not present

## 2020-07-25 DIAGNOSIS — H59811 Chorioretinal scars after surgery for detachment, right eye: Secondary | ICD-10-CM | POA: Diagnosis not present

## 2020-07-25 DIAGNOSIS — H35371 Puckering of macula, right eye: Secondary | ICD-10-CM | POA: Diagnosis not present

## 2020-07-25 DIAGNOSIS — H25812 Combined forms of age-related cataract, left eye: Secondary | ICD-10-CM | POA: Diagnosis not present

## 2020-07-25 DIAGNOSIS — H338 Other retinal detachments: Secondary | ICD-10-CM | POA: Diagnosis not present

## 2020-07-25 DIAGNOSIS — H353131 Nonexudative age-related macular degeneration, bilateral, early dry stage: Secondary | ICD-10-CM | POA: Diagnosis not present

## 2020-07-30 DIAGNOSIS — H30021 Focal chorioretinal inflammation of posterior pole, right eye: Secondary | ICD-10-CM | POA: Diagnosis not present

## 2020-07-30 DIAGNOSIS — H35721 Serous detachment of retinal pigment epithelium, right eye: Secondary | ICD-10-CM | POA: Diagnosis not present

## 2020-07-30 DIAGNOSIS — H353131 Nonexudative age-related macular degeneration, bilateral, early dry stage: Secondary | ICD-10-CM | POA: Diagnosis not present

## 2020-07-30 DIAGNOSIS — H35371 Puckering of macula, right eye: Secondary | ICD-10-CM | POA: Diagnosis not present

## 2020-07-30 DIAGNOSIS — K7689 Other specified diseases of liver: Secondary | ICD-10-CM | POA: Diagnosis not present

## 2020-07-30 DIAGNOSIS — H35363 Drusen (degenerative) of macula, bilateral: Secondary | ICD-10-CM | POA: Diagnosis not present

## 2020-07-31 DIAGNOSIS — Z20822 Contact with and (suspected) exposure to covid-19: Secondary | ICD-10-CM | POA: Diagnosis not present

## 2020-08-13 DIAGNOSIS — D649 Anemia, unspecified: Secondary | ICD-10-CM | POA: Diagnosis not present

## 2020-08-13 DIAGNOSIS — K7689 Other specified diseases of liver: Secondary | ICD-10-CM | POA: Diagnosis not present

## 2020-08-15 DIAGNOSIS — Z1211 Encounter for screening for malignant neoplasm of colon: Secondary | ICD-10-CM | POA: Diagnosis not present

## 2020-08-15 DIAGNOSIS — Z86010 Personal history of colonic polyps: Secondary | ICD-10-CM | POA: Diagnosis not present

## 2020-08-15 DIAGNOSIS — R109 Unspecified abdominal pain: Secondary | ICD-10-CM | POA: Diagnosis not present

## 2020-08-15 DIAGNOSIS — R1013 Epigastric pain: Secondary | ICD-10-CM | POA: Diagnosis not present

## 2020-08-20 DIAGNOSIS — M79609 Pain in unspecified limb: Secondary | ICD-10-CM | POA: Diagnosis not present

## 2020-08-20 DIAGNOSIS — R52 Pain, unspecified: Secondary | ICD-10-CM | POA: Diagnosis not present

## 2020-08-20 DIAGNOSIS — Z79899 Other long term (current) drug therapy: Secondary | ICD-10-CM | POA: Diagnosis not present

## 2020-08-20 DIAGNOSIS — Z01818 Encounter for other preprocedural examination: Secondary | ICD-10-CM

## 2020-08-20 DIAGNOSIS — E559 Vitamin D deficiency, unspecified: Secondary | ICD-10-CM | POA: Diagnosis not present

## 2020-08-20 DIAGNOSIS — J90 Pleural effusion, not elsewhere classified: Secondary | ICD-10-CM | POA: Diagnosis not present

## 2020-08-22 DIAGNOSIS — G4733 Obstructive sleep apnea (adult) (pediatric): Secondary | ICD-10-CM | POA: Diagnosis not present

## 2020-08-22 DIAGNOSIS — Z01818 Encounter for other preprocedural examination: Secondary | ICD-10-CM | POA: Diagnosis not present

## 2020-08-22 DIAGNOSIS — M1711 Unilateral primary osteoarthritis, right knee: Secondary | ICD-10-CM | POA: Diagnosis not present

## 2020-08-22 DIAGNOSIS — Z9989 Dependence on other enabling machines and devices: Secondary | ICD-10-CM | POA: Diagnosis not present

## 2020-09-17 DIAGNOSIS — Z1152 Encounter for screening for COVID-19: Secondary | ICD-10-CM | POA: Diagnosis not present

## 2020-09-17 DIAGNOSIS — Z1159 Encounter for screening for other viral diseases: Secondary | ICD-10-CM | POA: Diagnosis not present

## 2020-09-17 DIAGNOSIS — R52 Pain, unspecified: Secondary | ICD-10-CM | POA: Diagnosis not present

## 2020-09-17 DIAGNOSIS — M79609 Pain in unspecified limb: Secondary | ICD-10-CM | POA: Diagnosis not present

## 2020-09-17 DIAGNOSIS — M1711 Unilateral primary osteoarthritis, right knee: Secondary | ICD-10-CM | POA: Diagnosis not present

## 2020-09-17 DIAGNOSIS — Z79899 Other long term (current) drug therapy: Secondary | ICD-10-CM | POA: Diagnosis not present

## 2020-09-17 DIAGNOSIS — Z01818 Encounter for other preprocedural examination: Secondary | ICD-10-CM | POA: Diagnosis not present

## 2020-09-25 DIAGNOSIS — Z471 Aftercare following joint replacement surgery: Secondary | ICD-10-CM | POA: Diagnosis not present

## 2020-09-25 DIAGNOSIS — Z79899 Other long term (current) drug therapy: Secondary | ICD-10-CM | POA: Diagnosis not present

## 2020-09-25 DIAGNOSIS — K219 Gastro-esophageal reflux disease without esophagitis: Secondary | ICD-10-CM | POA: Diagnosis not present

## 2020-09-25 DIAGNOSIS — E785 Hyperlipidemia, unspecified: Secondary | ICD-10-CM | POA: Diagnosis not present

## 2020-09-25 DIAGNOSIS — M1711 Unilateral primary osteoarthritis, right knee: Secondary | ICD-10-CM | POA: Diagnosis not present

## 2020-09-25 DIAGNOSIS — G8918 Other acute postprocedural pain: Secondary | ICD-10-CM | POA: Diagnosis not present

## 2020-09-25 DIAGNOSIS — Z96651 Presence of right artificial knee joint: Secondary | ICD-10-CM | POA: Diagnosis not present

## 2020-09-25 DIAGNOSIS — Z87891 Personal history of nicotine dependence: Secondary | ICD-10-CM | POA: Diagnosis not present

## 2020-09-25 DIAGNOSIS — G4733 Obstructive sleep apnea (adult) (pediatric): Secondary | ICD-10-CM | POA: Diagnosis not present

## 2020-09-27 DIAGNOSIS — E7800 Pure hypercholesterolemia, unspecified: Secondary | ICD-10-CM | POA: Diagnosis not present

## 2020-09-27 DIAGNOSIS — K219 Gastro-esophageal reflux disease without esophagitis: Secondary | ICD-10-CM | POA: Diagnosis not present

## 2020-09-27 DIAGNOSIS — K5790 Diverticulosis of intestine, part unspecified, without perforation or abscess without bleeding: Secondary | ICD-10-CM | POA: Diagnosis not present

## 2020-09-27 DIAGNOSIS — M109 Gout, unspecified: Secondary | ICD-10-CM | POA: Diagnosis not present

## 2020-09-27 DIAGNOSIS — Z96651 Presence of right artificial knee joint: Secondary | ICD-10-CM | POA: Diagnosis not present

## 2020-09-27 DIAGNOSIS — K5900 Constipation, unspecified: Secondary | ICD-10-CM | POA: Diagnosis not present

## 2020-09-27 DIAGNOSIS — D539 Nutritional anemia, unspecified: Secondary | ICD-10-CM | POA: Diagnosis not present

## 2020-09-27 DIAGNOSIS — Z87891 Personal history of nicotine dependence: Secondary | ICD-10-CM | POA: Diagnosis not present

## 2020-09-27 DIAGNOSIS — Z9181 History of falling: Secondary | ICD-10-CM | POA: Diagnosis not present

## 2020-09-27 DIAGNOSIS — Z471 Aftercare following joint replacement surgery: Secondary | ICD-10-CM | POA: Diagnosis not present

## 2020-09-27 DIAGNOSIS — H35351 Cystoid macular degeneration, right eye: Secondary | ICD-10-CM | POA: Diagnosis not present

## 2020-09-28 ENCOUNTER — Observation Stay (HOSPITAL_COMMUNITY)
Admission: EM | Admit: 2020-09-28 | Discharge: 2020-09-29 | Disposition: A | Discharge status: Home / Self Care | Payer: PPO | Attending: Cardiology | Admitting: Cardiology

## 2020-09-28 ENCOUNTER — Encounter (HOSPITAL_COMMUNITY): Payer: Self-pay | Admitting: Physician Assistant

## 2020-09-28 ENCOUNTER — Emergency Department (HOSPITAL_BASED_OUTPATIENT_CLINIC_OR_DEPARTMENT_OTHER): Payer: PPO

## 2020-09-28 ENCOUNTER — Emergency Department (HOSPITAL_COMMUNITY): Payer: PPO

## 2020-09-28 ENCOUNTER — Other Ambulatory Visit: Payer: Self-pay

## 2020-09-28 DIAGNOSIS — I248 Other forms of acute ischemic heart disease: Secondary | ICD-10-CM | POA: Diagnosis not present

## 2020-09-28 DIAGNOSIS — R0602 Shortness of breath: Secondary | ICD-10-CM | POA: Diagnosis not present

## 2020-09-28 DIAGNOSIS — Z79899 Other long term (current) drug therapy: Secondary | ICD-10-CM | POA: Diagnosis not present

## 2020-09-28 DIAGNOSIS — R000 Tachycardia, unspecified: Secondary | ICD-10-CM | POA: Diagnosis not present

## 2020-09-28 DIAGNOSIS — M7989 Other specified soft tissue disorders: Secondary | ICD-10-CM | POA: Diagnosis not present

## 2020-09-28 DIAGNOSIS — R0989 Other specified symptoms and signs involving the circulatory and respiratory systems: Secondary | ICD-10-CM | POA: Diagnosis not present

## 2020-09-28 DIAGNOSIS — R0902 Hypoxemia: Secondary | ICD-10-CM | POA: Diagnosis not present

## 2020-09-28 DIAGNOSIS — R079 Chest pain, unspecified: Secondary | ICD-10-CM | POA: Diagnosis not present

## 2020-09-28 DIAGNOSIS — Z96651 Presence of right artificial knee joint: Secondary | ICD-10-CM | POA: Insufficient documentation

## 2020-09-28 DIAGNOSIS — I471 Supraventricular tachycardia: Secondary | ICD-10-CM | POA: Diagnosis not present

## 2020-09-28 DIAGNOSIS — Z20822 Contact with and (suspected) exposure to covid-19: Secondary | ICD-10-CM | POA: Insufficient documentation

## 2020-09-28 DIAGNOSIS — Z7982 Long term (current) use of aspirin: Secondary | ICD-10-CM | POA: Diagnosis not present

## 2020-09-28 DIAGNOSIS — R52 Pain, unspecified: Secondary | ICD-10-CM | POA: Diagnosis not present

## 2020-09-28 DIAGNOSIS — I517 Cardiomegaly: Secondary | ICD-10-CM | POA: Diagnosis not present

## 2020-09-28 DIAGNOSIS — R7989 Other specified abnormal findings of blood chemistry: Secondary | ICD-10-CM | POA: Diagnosis not present

## 2020-09-28 DIAGNOSIS — I472 Ventricular tachycardia: Secondary | ICD-10-CM

## 2020-09-28 HISTORY — DX: Other specified postprocedural states: Z98.890

## 2020-09-28 HISTORY — DX: Obstructive sleep apnea (adult) (pediatric): G47.33

## 2020-09-28 HISTORY — DX: Atherosclerosis of aorta: I70.0

## 2020-09-28 HISTORY — DX: Hyperlipidemia, unspecified: E78.5

## 2020-09-28 LAB — RESP PANEL BY RT-PCR (RSV, FLU A&B, COVID)  RVPGX2
Influenza A by PCR: NEGATIVE
Influenza B by PCR: NEGATIVE
Resp Syncytial Virus by PCR: NEGATIVE
SARS Coronavirus 2 by RT PCR: NEGATIVE

## 2020-09-28 LAB — URINALYSIS, ROUTINE W REFLEX MICROSCOPIC
Bilirubin Urine: NEGATIVE
Glucose, UA: NEGATIVE mg/dL
Hgb urine dipstick: NEGATIVE
Ketones, ur: 5 mg/dL — AB
Leukocytes,Ua: NEGATIVE
Nitrite: NEGATIVE
Protein, ur: NEGATIVE mg/dL
Specific Gravity, Urine: 1.01 (ref 1.005–1.030)
pH: 6 (ref 5.0–8.0)

## 2020-09-28 LAB — COMPREHENSIVE METABOLIC PANEL
ALT: 19 U/L (ref 0–44)
AST: 26 U/L (ref 15–41)
Albumin: 3.2 g/dL — ABNORMAL LOW (ref 3.5–5.0)
Alkaline Phosphatase: 66 U/L (ref 38–126)
Anion gap: 11 (ref 5–15)
BUN: 10 mg/dL (ref 8–23)
CO2: 24 mmol/L (ref 22–32)
Calcium: 8.6 mg/dL — ABNORMAL LOW (ref 8.9–10.3)
Chloride: 101 mmol/L (ref 98–111)
Creatinine, Ser: 0.89 mg/dL (ref 0.61–1.24)
GFR, Estimated: >60 mL/min (ref >60)
Glucose, Bld: 110 mg/dL — ABNORMAL HIGH (ref 70–99)
Potassium: 4.1 mmol/L (ref 3.5–5.1)
Sodium: 136 mmol/L (ref 135–145)
Total Bilirubin: 1 mg/dL (ref 0.3–1.2)
Total Protein: 6 g/dL — ABNORMAL LOW (ref 6.5–8.1)

## 2020-09-28 LAB — TROPONIN I (HIGH SENSITIVITY)
Troponin I (High Sensitivity): 92 ng/L — ABNORMAL HIGH (ref <18)
Troponin I (High Sensitivity): 103 ng/L (ref <18)

## 2020-09-28 LAB — CBC WITH DIFFERENTIAL/PLATELET
Abs Immature Granulocytes: 0.02 10*3/uL (ref 0.00–0.07)
Basophils Absolute: 0 10*3/uL (ref 0.0–0.1)
Basophils Relative: 0 %
Eosinophils Absolute: 0 10*3/uL (ref 0.0–0.5)
Eosinophils Relative: 0 %
HCT: 35 % — ABNORMAL LOW (ref 39.0–52.0)
Hemoglobin: 11.6 g/dL — ABNORMAL LOW (ref 13.0–17.0)
Immature Granulocytes: 0 %
Lymphocytes Relative: 8 %
Lymphs Abs: 0.6 10*3/uL — ABNORMAL LOW (ref 0.7–4.0)
MCH: 35.8 pg — ABNORMAL HIGH (ref 26.0–34.0)
MCHC: 33.1 g/dL (ref 30.0–36.0)
MCV: 108 fL — ABNORMAL HIGH (ref 80.0–100.0)
Monocytes Absolute: 0.8 10*3/uL (ref 0.1–1.0)
Monocytes Relative: 11 %
Neutro Abs: 6.3 10*3/uL (ref 1.7–7.7)
Neutrophils Relative %: 81 %
Platelets: 125 10*3/uL — ABNORMAL LOW (ref 150–400)
RBC: 3.24 MIL/uL — ABNORMAL LOW (ref 4.22–5.81)
RDW: 11.7 % (ref 11.5–15.5)
WBC: 7.8 10*3/uL (ref 4.0–10.5)
nRBC: 0 % (ref 0.0–0.2)

## 2020-09-28 LAB — LACTIC ACID, PLASMA
Lactic Acid, Venous: 1 mmol/L (ref 0.5–1.9)
Lactic Acid, Venous: 1 mmol/L (ref 0.5–1.9)

## 2020-09-28 LAB — TSH: TSH: 1.367 u[IU]/mL (ref 0.350–4.500)

## 2020-09-28 LAB — PROTIME-INR
INR: 1 (ref 0.8–1.2)
Prothrombin Time: 13.1 seconds (ref 11.4–15.2)

## 2020-09-28 LAB — PHOSPHORUS: Phosphorus: 3.5 mg/dL (ref 2.5–4.6)

## 2020-09-28 LAB — MAGNESIUM: Magnesium: 2 mg/dL (ref 1.7–2.4)

## 2020-09-28 LAB — LIPASE, BLOOD: Lipase: 19 U/L (ref 11–51)

## 2020-09-28 MED ORDER — METOPROLOL TARTRATE 25 MG PO TABS
25.0000 mg | ORAL_TABLET | Freq: Two times a day (BID) | ORAL | Status: DC
  Administered 2020-09-28 – 2020-09-29 (×3): 25 mg via ORAL
  Filled 2020-09-28 (×3): qty 1

## 2020-09-28 MED ORDER — PANTOPRAZOLE SODIUM 40 MG IV SOLR
40.0000 mg | Freq: Once | INTRAVENOUS | Status: AC
  Administered 2020-09-28: 40 mg via INTRAVENOUS
  Filled 2020-09-28: qty 40

## 2020-09-28 MED ORDER — LACTATED RINGERS IV SOLN: INTRAVENOUS | Status: DC

## 2020-09-28 MED ORDER — MORPHINE SULFATE (PF) 4 MG/ML IV SOLN
4.0000 mg | Freq: Once | INTRAVENOUS | Status: AC
  Administered 2020-09-28: 4 mg via INTRAVENOUS
  Filled 2020-09-28: qty 1

## 2020-09-28 NOTE — ED Provider Notes (Signed)
3:49 PM Care assumed from Dr. Vallery Ridge.  At time of transfer of care, patient is awaiting cardiology consultation for their assessment and recommendations for this patient who has "heartburn" and chest discomfort in the setting of elevated troponin and a wide-complex tachycardia with EMS today that resolved after lidocaine and subsequent adenosine administration.  Patient recently had a knee surgery 3 days ago but had ultrasound today that did not show evidence of DVT.  Anticipate following up on cardiology recommendations.  Cardiology will admit for further management.  Clinical Impression: 1. Wide-complex tachycardia ([name])   2. Nonspecific chest pain   3. Status post right knee replacement     Disposition: Admit  This note was prepared with assistance of Dragon voice recognition software. Occasional wrong-word or sound-a-like substitutions may have occurred due to the inherent limitations of voice recognition software.     Lawrence Gomez, [name], MD 09/28/20 2352
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
## 2020-09-28 NOTE — ED Provider Notes (Signed)
[facility]PART[facility] Provider Note   CSN: [id number] Arrival date & time: 09/28/20  1205     History Chief Complaint  Patient presents with  . Tachycardia  . Gastroesophageal Reflux    Lawrence Gomez is a 74 y.o. male.  HPI Patient is status post right knee replacement 3 days ago.  He reports he has been doing well postoperatively.  He did physical therapy yesterday and did well.  He reports he has been having very large amount of "belching and gas".  He reports he feels like he has reflux and some burning in the chest.  He has attributed this to GI symptoms since there is been so much belching.  He has been trying his Protonix without relief.  He thought possibly it was due to the pain medications he has been taking for the knee.  Today, when physical therapist came and checked his vital signs, his heart rate was noted to be greater than 200.  Patient denies he could perceive any palpitations in his chest.  He did not have a syncopal episode.  EMS was called.  Patient was found to be in a wide-complex tachycardia.  He was given lidocaine IV bolus without change.  He was given 6 mg adenosine the patient converted to a sinus rhythm.  Patient does not endorse chest pain at this time but he does continue to endorse a feeling of reflux and need to belch.  He denies that he has any increased shortness of breath.  He reports he does have pain and swelling in the right leg.  He is assuming that is due to his surgery.  He has been having some chills but thought it might be due to frequently having an ice pack on his leg.  No documented fever.  He denies he is coughing.  Patient has been immunized for Covid.  He did previously have a breakthrough case of Covid despite immunization but has recovered without complication.  He reports he has had a poor appetite since his procedure but is trying to stay hydrated.      No past medical history on file.  Patient Active Problem  List   Diagnosis Date Noted  . 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 Right epiretinal membrane 02/09/2020         No family history on file.  Social History   Tobacco Use  . Smoking status: Not on file  Substance Use Topics  . Alcohol use: Not on file  . Drug use: Not on file    Home Medications Prior to Admission medications   Medication Sig Start Date End Date Taking? Authorizing Provider  acetaminophen (TYLENOL) 500 MG tablet Take 1,000 mg by mouth 2 (two) times daily as needed for mild pain.   Yes [provider]  aspirin 325 MG EC tablet Take 325 mg by mouth in the morning and at bedtime.   Yes [provider]  doxylamine, Sleep, (UNISOM) 25 MG tablet Take 25 mg by mouth at bedtime as needed for sleep.   Yes [provider]  Multiple Vitamins-Minerals (PRESERVISION AREDS 2+MULTI VIT) CAPS Take 1 capsule by mouth in the morning and at bedtime.   Yes [provider]  oxyCODONE (OXY IR/ROXICODONE) 5 MG immediate release tablet Take 5 mg by mouth every 4 (four) hours as needed for severe pain.   Yes [provider]  pantoprazole (PROTONIX) 40 MG  tablet Take 40 mg by mouth daily. 07/26/16  Yes [provider]  pravastatin (PRAVACHOL) 20 MG tablet Take 20 mg by mouth every other day.   Yes [provider]  sennosides-docusate sodium (SENOKOT-S) 8.6-50 MG tablet Take 1 tablet by mouth daily as needed for constipation.   Yes [provider]    Allergies    Penicillins  Review of Systems   Review of Systems 10 systems reviewed and negative except as per HPI Physical Exam Updated Vital Signs BP 140/84   Pulse 79   Temp 98.4 F (36.9 C) (Oral)   Resp 18   Ht 5\' 11"  (1.803 m)   Wt 79.4 kg   SpO2 98%   BMI 24.41 kg/m   Physical Exam Constitutional:      Comments: Alert and nontoxic.  Clinically well in appearance.  No respiratory distress.   HENT:     Mouth/Throat:     Pharynx: Oropharynx is clear.  Eyes:     Extraocular Movements: Extraocular movements intact.  Cardiovascular:     Rate and Rhythm: Normal rate and regular rhythm.  Pulmonary:     Effort: Pulmonary effort is normal.     Breath sounds: Normal breath sounds.  Abdominal:     General: There is no distension.     Palpations: Abdomen is soft.     Tenderness: There is no abdominal tenderness. There is no guarding.  Musculoskeletal:     Comments: Incision anterior right knee.  Clean dressing in place.  Moderate to large diffuse swelling of the knee and lower leg on the right.  Feet are warm and dry.  Distal pulses are 2+.  Skin:    General: Skin is warm and dry.  Neurological:     General: No focal deficit present.     Mental Status: He is oriented to person, place, and time.     Cranial Nerves: No cranial nerve deficit.     Sensory: No sensory deficit.     Coordination: Coordination normal.  Psychiatric:        Mood and Affect: Mood normal.     ED Results / Procedures / Treatments   Labs (all labs ordered are listed, but only abnormal results are displayed) Labs Reviewed  COMPREHENSIVE METABOLIC PANEL - Abnormal; Notable for the following components:      Result Value   Glucose, Bld 110 (*)    Calcium 8.6 (*)    Total Protein 6.0 (*)    Albumin 3.2 (*)    All other components within normal limits  CBC WITH DIFFERENTIAL/PLATELET - Abnormal; Notable for the following components:   RBC 3.24 (*)    Hemoglobin 11.6 (*)    HCT 35.0 (*)    MCV 108.0 (*)    MCH 35.8 (*)    Platelets 125 (*)    Lymphs Abs 0.6 (*)    All other components within normal limits  TROPONIN I (HIGH SENSITIVITY) - Abnormal; Notable for the following components:   Troponin I (High Sensitivity) 92 (*)    All other components within normal limits  LIPASE, BLOOD  LACTIC ACID, PLASMA  PROTIME-INR  MAGNESIUM  PHOSPHORUS  TSH  LACTIC ACID, PLASMA  URINALYSIS, ROUTINE W REFLEX  MICROSCOPIC  TROPONIN I (HIGH SENSITIVITY)    EKG EKG Interpretation  Date/Time:  Friday September 28 2020 12:11:24 EST Ventricular Rate:  93 PR Interval:    QRS Duration: 113 QT Interval:  377 QTC Calculation: 469 R Axis:   -28 Text  Interpretation: Sinus rhythm Probable left atrial enlargement Incomplete RBBB and LAFB Left ventricular hypertrophy Isolated III ST elevation, may be wandering baseline. need repeat Confirmed by Charlesetta Shanks [phone]) on 09/28/2020 12:38:25 PM   Radiology DG Chest Port 1 View  Result Date: 09/28/2020 CLINICAL DATA:  tachycardia EXAM: PORTABLE CHEST 1 VIEW COMPARISON:  08/20/2020 chest radiograph and prior. FINDINGS: No focal consolidation, pneumothorax or pleural effusion. Calcified right basilar nodules, unchanged. Mild cardiomegaly. No acute osseous abnormality. IMPRESSION: No acute process in the chest. Mild cardiomegaly. Electronically Signed   By: Primitivo Gauze M.D.   On: 09/28/2020 13:45   VAS [place] LOWER EXTREMITY VENOUS (DVT) (ONLY [facility] & [facility] 7a-7p)  Result Date: 09/28/2020  Lower Venous DVT Study Indications: Swelling.  Risk Factors: Surgery. Limitations: Poor ultrasound/tissue interface. Comparison Study: No prior studies. Performing Technologist: Oliver Hum RVT  Examination Guidelines: A complete evaluation includes B-mode imaging, spectral Doppler, color Doppler, and power Doppler as needed of all accessible portions of each vessel. Bilateral testing is considered an integral part of a complete examination. Limited examinations for reoccurring indications may be performed as noted. The reflux portion of the exam is performed with the patient in reverse Trendelenburg.  +---------+---------------+---------+-----------+----------+--------------+ RIGHT    CompressibilityPhasicitySpontaneityPropertiesThrombus Aging +---------+---------------+---------+-----------+----------+--------------+ CFV      Full           Yes      Yes                                  +---------+---------------+---------+-----------+----------+--------------+ SFJ      Full                                                        +---------+---------------+---------+-----------+----------+--------------+ FV Prox  Full                                                        +---------+---------------+---------+-----------+----------+--------------+ FV Mid   Full                                                        +---------+---------------+---------+-----------+----------+--------------+ FV Distal               Yes      Yes                                 +---------+---------------+---------+-----------+----------+--------------+ PFV      Full                                                        +---------+---------------+---------+-----------+----------+--------------+ POP      Full           Yes      Yes                                 +---------+---------------+---------+-----------+----------+--------------+  PTV      Full                                                        +---------+---------------+---------+-----------+----------+--------------+ PERO     Full                                                        +---------+---------------+---------+-----------+----------+--------------+   +---------+---------------+---------+-----------+----------+--------------+ LEFT     CompressibilityPhasicitySpontaneityPropertiesThrombus Aging +---------+---------------+---------+-----------+----------+--------------+ CFV      Full           Yes      Yes                                 +---------+---------------+---------+-----------+----------+--------------+ SFJ      Full                                                        +---------+---------------+---------+-----------+----------+--------------+ FV Prox  Full                                                         +---------+---------------+---------+-----------+----------+--------------+ FV Mid   Full                                                        +---------+---------------+---------+-----------+----------+--------------+ FV DistalFull                                                        +---------+---------------+---------+-----------+----------+--------------+ PFV      Full                                                        +---------+---------------+---------+-----------+----------+--------------+ POP      Full           Yes      Yes                                 +---------+---------------+---------+-----------+----------+--------------+ PTV      Full                                                        +---------+---------------+---------+-----------+----------+--------------+  PERO     Full                                                        +---------+---------------+---------+-----------+----------+--------------+     Summary: RIGHT: - There is no evidence of deep vein thrombosis in the lower extremity. However, portions of this examination were limited- see technologist comments above.  - No cystic structure found in the popliteal fossa.  LEFT: - There is no evidence of deep vein thrombosis in the lower extremity.  - No cystic structure found in the popliteal fossa.  *See table(s) above for measurements and observations.    Preliminary     Procedures Procedures (including critical care time) CRITICAL CARE Performed by: Charlesetta Shanks   Total critical care time: 30 minutes  Critical care time was exclusive of separately billable procedures and treating other patients.  Critical care was necessary to treat or prevent imminent or life-threatening deterioration.  Critical care was time spent personally by me on the following activities: development of treatment plan with patient and/or surrogate as well as nursing, discussions with consultants,  evaluation of patient's response to treatment, examination of patient, obtaining history from patient or surrogate, ordering and performing treatments and interventions, ordering and review of laboratory studies, ordering and review of radiographic studies, pulse oximetry and re-evaluation of patient's condition. Medications Ordered in ED Medications  lactated ringers infusion (has no administration in time range)  morphine 4 MG/ML injection 4 mg (4 mg Intravenous Given 09/28/20 1350)  pantoprazole (PROTONIX) injection 40 mg (40 mg Intravenous Given 09/28/20 1349)    ED Course  I have reviewed the triage vital signs and the nursing notes.  Pertinent labs & imaging results that were available during my care of the patient were reviewed by me and considered in my medical decision making (see chart for details).    MDM Rules/Calculators/A&P                          Patient presents as outlined above.  He had a wide-complex tachycardia by EMS rhythm strip in leads II and aVF.  Lead III appears more likely to be narrow complex with exaggerated T wave repolarization.  Reportedly lidocaine was tried without resolution and patient responded to adenosine.  Twelve-lead post conversion does not have ST segment elevation MI pattern present.  Proceed with chest x-ray, troponin, lab work and cardiology consultation.  At this time, patient has clear mental status, no respiratory distress, normotensive with regular controlled heart rate.  He does complain of "GERD" symptoms for the past day.  Will need to rule out anginal equivalent.  Dr. Sherry Ruffing will consult cardiology for further evaluation and disposition. Final Clinical Impression(s) / ED Diagnoses Final diagnoses:  Wide-complex tachycardia ([place])  Nonspecific chest pain  Status post right knee replacement    Rx / DC Orders ED Discharge Orders    None       Charlesetta Shanks, MD 09/28/20 1551

## 2020-09-28 NOTE — ED Notes (Signed)
Date and time results received: 09/28/20 1713   Test: Troponin Critical Value: 103  Name of Provider Notified: Dr. Sherry Ruffing  Orders Received? Or Actions Taken?: Actions Taken: MD aware

## 2020-09-28 NOTE — Progress Notes (Signed)
Bilateral lower extremity venous duplex has been completed. Preliminary results can be found in CV Proc through chart review.  Results were given to Dr. Johnney Killian.  09/28/20 2:05 PM Lawrence Gomez RVT

## 2020-09-28 NOTE — ED Triage Notes (Signed)
Pt brought to ED via EMS from home after home health physical therapist reported pt's heart rate was 190 during session today. Pt states he had right knee surgery on Tuesday and has been experiencing increased knee pain, constant acid reflux and belching since last night. EMS reports EKG showed wide complex tachycardia, IV lidocaine given without change. 6mg  adenosine given, patient converted to NSR. Patient alert and oriented x 4 on arrival to ED. NSR on monitor, HR 89. Pt currently denies chest pain, shortness of breath, n/v. Hx gout, HLD, OSA.  EMS v/s: 130/100 BP 24 RR 95% on room air

## 2020-09-28 NOTE — ED Notes (Signed)
Patient yelling at this RN wondering why he is still in the ED. Patient updated that we are waiting for a bed to be assigned. Patient questioning why it is taking so long. Patient states "well I can check out can't I?" Patient educated of his right to leave against medical advice. Patient states he either wants a larger mattress or to sign out ama. Patient educated that ED staff does not have control regarding inpatient bed assignment. Patient provided with an inpatient hospital bed. Patient stating "I'll stick it out a little longer to see if I leave." Will continue to monitor.

## 2020-09-29 ENCOUNTER — Other Ambulatory Visit: Payer: Self-pay | Admitting: Medical

## 2020-09-29 ENCOUNTER — Other Ambulatory Visit (HOSPITAL_COMMUNITY): Payer: PPO

## 2020-09-29 ENCOUNTER — Inpatient Hospital Stay (HOSPITAL_BASED_OUTPATIENT_CLINIC_OR_DEPARTMENT_OTHER): Payer: PPO

## 2020-09-29 ENCOUNTER — Inpatient Hospital Stay (HOSPITAL_COMMUNITY): Payer: PPO

## 2020-09-29 DIAGNOSIS — I351 Nonrheumatic aortic (valve) insufficiency: Secondary | ICD-10-CM | POA: Diagnosis not present

## 2020-09-29 DIAGNOSIS — I471 Supraventricular tachycardia, unspecified: Secondary | ICD-10-CM

## 2020-09-29 DIAGNOSIS — R000 Tachycardia, unspecified: Secondary | ICD-10-CM | POA: Diagnosis not present

## 2020-09-29 DIAGNOSIS — R911 Solitary pulmonary nodule: Secondary | ICD-10-CM | POA: Diagnosis not present

## 2020-09-29 DIAGNOSIS — I248 Other forms of acute ischemic heart disease: Secondary | ICD-10-CM | POA: Diagnosis not present

## 2020-09-29 DIAGNOSIS — R7989 Other specified abnormal findings of blood chemistry: Secondary | ICD-10-CM

## 2020-09-29 DIAGNOSIS — I472 Ventricular tachycardia: Secondary | ICD-10-CM | POA: Diagnosis not present

## 2020-09-29 LAB — ECHOCARDIOGRAM COMPLETE
Area-P 1/2: 2.73 cm2
Calc EF: 49.4 %
Height: 71 in
S' Lateral: 4.2 cm
Single Plane A2C EF: 53.6 %
Single Plane A4C EF: 47.8 %
Weight: 2872 oz

## 2020-09-29 LAB — BASIC METABOLIC PANEL
Anion gap: 7 (ref 5–15)
BUN: 11 mg/dL (ref 8–23)
CO2: 28 mmol/L (ref 22–32)
Calcium: 8.6 mg/dL — ABNORMAL LOW (ref 8.9–10.3)
Chloride: 103 mmol/L (ref 98–111)
Creatinine, Ser: 0.96 mg/dL (ref 0.61–1.24)
GFR, Estimated: >60 mL/min (ref >60)
Glucose, Bld: 115 mg/dL — ABNORMAL HIGH (ref 70–99)
Potassium: 3.8 mmol/L (ref 3.5–5.1)
Sodium: 138 mmol/L (ref 135–145)

## 2020-09-29 LAB — CBC
HCT: 32.4 % — ABNORMAL LOW (ref 39.0–52.0)
Hemoglobin: 11.1 g/dL — ABNORMAL LOW (ref 13.0–17.0)
MCH: 36.6 pg — ABNORMAL HIGH (ref 26.0–34.0)
MCHC: 34.3 g/dL (ref 30.0–36.0)
MCV: 106.9 fL — ABNORMAL HIGH (ref 80.0–100.0)
Platelets: 130 10*3/uL — ABNORMAL LOW (ref 150–400)
RBC: 3.03 MIL/uL — ABNORMAL LOW (ref 4.22–5.81)
RDW: 11.7 % (ref 11.5–15.5)
WBC: 6.2 10*3/uL (ref 4.0–10.5)
nRBC: 0 % (ref 0.0–0.2)

## 2020-09-29 LAB — TROPONIN I (HIGH SENSITIVITY): Troponin I (High Sensitivity): 68 ng/L — ABNORMAL HIGH (ref <18)

## 2020-09-29 LAB — LIPID PANEL
Cholesterol: 150 mg/dL (ref 0–200)
HDL: 70 mg/dL (ref >40)
LDL Cholesterol: 66 mg/dL (ref 0–99)
Total CHOL/HDL Ratio: 2.1 RATIO
Triglycerides: 72 mg/dL (ref <150)
VLDL: 14 mg/dL (ref 0–40)

## 2020-09-29 LAB — D-DIMER, QUANTITATIVE: D-Dimer, Quant: 3.88 ug/mL-FEU — ABNORMAL HIGH (ref 0.00–0.50)

## 2020-09-29 MED ORDER — ASPIRIN EC 325 MG PO TBEC
325.0000 mg | DELAYED_RELEASE_TABLET | Freq: Every day | ORAL | Status: DC
  Filled 2020-09-29: qty 1

## 2020-09-29 MED ORDER — ACETAMINOPHEN 500 MG PO TABS: 1000.0000 mg | ORAL_TABLET | Freq: Two times a day (BID) | ORAL | Status: DC | PRN

## 2020-09-29 MED ORDER — PANTOPRAZOLE SODIUM 40 MG PO TBEC
40.0000 mg | DELAYED_RELEASE_TABLET | Freq: Every day | ORAL | Status: DC
  Administered 2020-09-29: 40 mg via ORAL
  Filled 2020-09-29: qty 1

## 2020-09-29 MED ORDER — SENNOSIDES-DOCUSATE SODIUM 8.6-50 MG PO TABS: 1.0000 | ORAL_TABLET | Freq: Every day | ORAL | Status: DC | PRN

## 2020-09-29 MED ORDER — PRAVASTATIN SODIUM 10 MG PO TABS
20.0000 mg | ORAL_TABLET | ORAL | Status: DC
  Filled 2020-09-29: qty 2

## 2020-09-29 MED ORDER — OXYCODONE HCL 5 MG PO TABS: 5.0000 mg | ORAL_TABLET | ORAL | Status: DC | PRN

## 2020-09-29 MED ORDER — ONDANSETRON HCL 4 MG/2ML IJ SOLN: 4.0000 mg | Freq: Four times a day (QID) | INTRAMUSCULAR | Status: DC | PRN

## 2020-09-29 MED ORDER — PERFLUTREN LIPID MICROSPHERE
1.0000 mL | INTRAVENOUS | Status: DC | PRN
  Administered 2020-09-29: 2 mL via INTRAVENOUS
  Filled 2020-09-29: qty 10

## 2020-09-29 MED ORDER — METOPROLOL TARTRATE 25 MG PO TABS: 25.0000 mg | ORAL_TABLET | Freq: Two times a day (BID) | ORAL | 6 refills | Status: DC

## 2020-09-29 MED ORDER — IOHEXOL 350 MG/ML SOLN
80.0000 mL | Freq: Once | INTRAVENOUS | Status: AC | PRN
  Administered 2020-09-29: 80 mL via INTRAVENOUS

## 2020-09-29 MED ORDER — HEPARIN SODIUM (PORCINE) 5000 UNIT/ML IJ SOLN: 5000.0000 [IU] | Freq: Three times a day (TID) | INTRAMUSCULAR | Status: DC

## 2020-09-29 NOTE — Progress Notes (Signed)
Progress Note  Patient Name: Lawrence Gomez Date of Encounter: 09/29/2020  [facility] Cardiologist: Shirlee More, MD   Subjective   Denies any chest pain or dyspnea  Inpatient Medications    Scheduled Meds: . aspirin  325 mg Oral Daily  . heparin  5,000 Units Subcutaneous Q8H  . metoprolol tartrate  25 mg Oral BID  . pantoprazole  40 mg Oral Daily  . pravastatin  20 mg Oral QODAY   Continuous Infusions:  PRN Meds: acetaminophen, ondansetron (ZOFRAN) IV, oxyCODONE, senna-docusate   Vital Signs    Vitals:   09/29/20 0845 09/29/20 0930 09/29/20 1025 09/29/20 1042  BP: (!) 150/80 135/78 (!) 132/99 (!) 149/84  Pulse: 79 77 86 84  Resp: 17 20 17 18   Temp:      TempSrc:      SpO2: 98% 98% 100% 99%  Weight:    81.4 kg  Height:    5\' 11"  (1.803 m)    Intake/Output Summary (Last 24 hours) at 09/29/2020 1116 Last data filed at 09/28/2020 2031 Gross per 24 hour  Intake 437.7 ml  Output --  Net 437.7 ml   Last 3 Weights 09/29/2020 09/28/2020  Weight (lbs) 179 lb 8 oz 175 lb  Weight (kg) 81.421 kg 79.379 kg      Telemetry    10 beat run of SVT, normal sinus rhythm in 70s- Personally Reviewed  ECG    No new EKG- Personally Reviewed  Physical Exam   GEN: No acute distress.   Neck: No JVD Cardiac: RRR, no murmurs, rubs, or gallops.  Respiratory: Clear to auscultation bilaterally. GI: Soft, nontender, non-distended  MS: No edema; No deformity. Neuro:  Nonfocal  Psych: Normal affect   Labs    High Sensitivity Troponin:   Recent Labs  Lab 09/28/20 1414 09/28/20 1557 09/29/20 0755  TROPONINIHS 92* 103* 68*      Chemistry Recent Labs  Lab 09/28/20 1414 09/29/20 0755  NA 136 138  K 4.1 3.8  CL 101 103  CO2 24 28  GLUCOSE 110* 115*  BUN 10 11  CREATININE 0.89 0.96  CALCIUM 8.6* 8.6*  PROT 6.0*  --   ALBUMIN 3.2*  --   AST 26  --   ALT 19  --   ALKPHOS 66  --   BILITOT 1.0  --   GFRNONAA >60 >60  ANIONGAP 11 7     Hematology Recent  Labs  Lab 09/28/20 1414 09/29/20 0755  WBC 7.8 6.2  RBC 3.24* 3.03*  HGB 11.6* 11.1*  HCT 35.0* 32.4*  MCV 108.0* 106.9*  MCH 35.8* 36.6*  MCHC 33.1 34.3  RDW 11.7 11.7  PLT 125* 130*    BNPNo results for input(s): BNP, PROBNP in the last 168 hours.   DDimer  Recent Labs  Lab 09/29/20 0908  DDIMER 3.88*     Radiology    DG Chest Port 1 View  Result Date: 09/28/2020 CLINICAL DATA:  tachycardia EXAM: PORTABLE CHEST 1 VIEW COMPARISON:  08/20/2020 chest radiograph and prior. FINDINGS: No focal consolidation, pneumothorax or pleural effusion. Calcified right basilar nodules, unchanged. Mild cardiomegaly. No acute osseous abnormality. IMPRESSION: No acute process in the chest. Mild cardiomegaly. Electronically Signed   By: Primitivo Gauze M.D.   On: 09/28/2020 13:45   VAS [place] LOWER EXTREMITY VENOUS (DVT) (ONLY [facility] & [facility] 7a-7p)  Result Date: 09/28/2020  Lower Venous DVT Study Indications: Swelling.  Risk Factors: Surgery. Limitations: Poor ultrasound/tissue interface. Comparison Study: No prior studies.  Performing Technologist: Oliver Hum RVT  Examination Guidelines: A complete evaluation includes B-mode imaging, spectral Doppler, color Doppler, and power Doppler as needed of all accessible portions of each vessel. Bilateral testing is considered an integral part of a complete examination. Limited examinations for reoccurring indications may be performed as noted. The reflux portion of the exam is performed with the patient in reverse Trendelenburg.  +---------+---------------+---------+-----------+----------+--------------+ RIGHT    CompressibilityPhasicitySpontaneityPropertiesThrombus Aging +---------+---------------+---------+-----------+----------+--------------+ CFV      Full           Yes      Yes                                 +---------+---------------+---------+-----------+----------+--------------+ SFJ      Full                                                         +---------+---------------+---------+-----------+----------+--------------+ FV Prox  Full                                                        +---------+---------------+---------+-----------+----------+--------------+ FV Mid   Full                                                        +---------+---------------+---------+-----------+----------+--------------+ FV Distal               Yes      Yes                                 +---------+---------------+---------+-----------+----------+--------------+ PFV      Full                                                        +---------+---------------+---------+-----------+----------+--------------+ POP      Full           Yes      Yes                                 +---------+---------------+---------+-----------+----------+--------------+ PTV      Full                                                        +---------+---------------+---------+-----------+----------+--------------+ PERO     Full                                                        +---------+---------------+---------+-----------+----------+--------------+   +---------+---------------+---------+-----------+----------+--------------+  LEFT     CompressibilityPhasicitySpontaneityPropertiesThrombus Aging +---------+---------------+---------+-----------+----------+--------------+ CFV      Full           Yes      Yes                                 +---------+---------------+---------+-----------+----------+--------------+ SFJ      Full                                                        +---------+---------------+---------+-----------+----------+--------------+ FV Prox  Full                                                        +---------+---------------+---------+-----------+----------+--------------+ FV Mid   Full                                                         +---------+---------------+---------+-----------+----------+--------------+ FV DistalFull                                                        +---------+---------------+---------+-----------+----------+--------------+ PFV      Full                                                        +---------+---------------+---------+-----------+----------+--------------+ POP      Full           Yes      Yes                                 +---------+---------------+---------+-----------+----------+--------------+ PTV      Full                                                        +---------+---------------+---------+-----------+----------+--------------+ PERO     Full                                                        +---------+---------------+---------+-----------+----------+--------------+     Summary: RIGHT: - There is no evidence of deep vein thrombosis in the lower extremity. However, portions of this examination were limited- see technologist comments above.  - No cystic structure found in the popliteal fossa.  LEFT: - There is no evidence  of deep vein thrombosis in the lower extremity.  - No cystic structure found in the popliteal fossa.  *See table(s) above for measurements and observations.    Preliminary     Cardiac Studies    Patient Profile     74 y.o. male OSA on CPAP and mild hyperlipidemia, presented to [facility] with wide complex tachycardia.  Assessment & Plan    Wide-complex tachycardia: VT versus SVT with aberrancy.  Does have EKG features of VT (very wide QRS without typical RBBB or LBBB pattern and northwest axis) but the fact that he was asymptomatic and responsive to adenosine argues more for SVT with aberrancy.  Reviewed with Dr. Caryl Comes in EP, felt likely SVT.  Recommended ruling out PE as can see SVT triggered by PE postoperatively -Continue metoprolol 25 mg twice daily -D-dimer was elevated, will order CTPA -Echocardiogram  Disposition: If CTPA  negative, can plan discharge today on metoprolol with follow-up with Dr. Bettina Gavia.  Ideally would also obtain echocardiogram, but patient does not wish to stay to have echo done, can be done as outpatient  For questions or updates, please contact Katherine Please consult [email or web address] for contact info under        Signed, Donato Heinz, MD  09/29/2020, 11:16 AM
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
## 2020-09-29 NOTE — ED Notes (Signed)
Pt ambulated to restroom with use of walker. No difficulties pt returns to room placed back on monitor.

## 2020-09-29 NOTE — ED Notes (Signed)
Pt provided with apple juice and sandwich. Pt also provided with phone. Pt in NAD. Side rails up. Resp even and unlabored. Call light within reach.

## 2020-09-29 NOTE — Care Management Obs Status (Signed)
[place] NOTIFICATION   Patient Details  Name: OLANREWAJU OSBORN MRN: [id number] Date of Birth: [birth year]   Medicare Observation Status Notification Given:  Yes    Carles Collet, RN 09/29/2020, 2:51 PM

## 2020-09-29 NOTE — Progress Notes (Signed)
  Echocardiogram 2D Echocardiogram has been performed.  Geoffery Lyons [place] 09/29/2020, 2:12 PM

## 2020-09-29 NOTE — Care Management CC44 (Signed)
Condition Code 44 Documentation Completed  Patient Details  Name: Lawrence Gomez MRN: [id number] Date of Birth: Mar 28, 1946   Condition Code 44 given:  Yes Patient signature on Condition Code 44 notice:  Yes Documentation of 2 MD's agreement:  Yes Code 44 added to claim:  Yes    Carles Collet, RN 09/29/2020, 2:51 PM

## 2020-09-29 NOTE — Plan of Care (Signed)

## 2020-09-29 NOTE — ED Notes (Signed)
Pt refusing all medications ordered on the [facility]. Pt states he wants a second opinion from a second doctor before he takes any other medications.

## 2020-09-29 NOTE — ED Notes (Signed)
Urinal emptied for patient. Pt in NAD. Resp even and unlabored.

## 2020-09-29 NOTE — ED Notes (Signed)
Breakfast Ordered 

## 2020-10-01 DIAGNOSIS — Z96651 Presence of right artificial knee joint: Secondary | ICD-10-CM | POA: Diagnosis not present

## 2020-10-03 ENCOUNTER — Other Ambulatory Visit: Payer: Self-pay | Admitting: *Deleted

## 2020-10-03 DIAGNOSIS — I471 Supraventricular tachycardia: Secondary | ICD-10-CM

## 2020-10-06 ENCOUNTER — Ambulatory Visit (INDEPENDENT_AMBULATORY_CARE_PROVIDER_SITE_OTHER): Payer: PPO

## 2020-10-06 DIAGNOSIS — I471 Supraventricular tachycardia: Secondary | ICD-10-CM

## 2020-10-07 DIAGNOSIS — I471 Supraventricular tachycardia: Secondary | ICD-10-CM | POA: Diagnosis not present

## 2020-10-09 DIAGNOSIS — R269 Unspecified abnormalities of gait and mobility: Secondary | ICD-10-CM | POA: Diagnosis not present

## 2020-10-09 DIAGNOSIS — M6281 Muscle weakness (generalized): Secondary | ICD-10-CM | POA: Diagnosis not present

## 2020-10-09 DIAGNOSIS — M25661 Stiffness of right knee, not elsewhere classified: Secondary | ICD-10-CM | POA: Diagnosis not present

## 2020-10-11 DIAGNOSIS — R269 Unspecified abnormalities of gait and mobility: Secondary | ICD-10-CM | POA: Diagnosis not present

## 2020-10-11 DIAGNOSIS — M25661 Stiffness of right knee, not elsewhere classified: Secondary | ICD-10-CM | POA: Diagnosis not present

## 2020-10-11 DIAGNOSIS — M6281 Muscle weakness (generalized): Secondary | ICD-10-CM | POA: Diagnosis not present

## 2020-10-15 DIAGNOSIS — R269 Unspecified abnormalities of gait and mobility: Secondary | ICD-10-CM | POA: Diagnosis not present

## 2020-10-15 DIAGNOSIS — M6281 Muscle weakness (generalized): Secondary | ICD-10-CM | POA: Diagnosis not present

## 2020-10-15 DIAGNOSIS — M25661 Stiffness of right knee, not elsewhere classified: Secondary | ICD-10-CM | POA: Diagnosis not present

## 2020-10-18 DIAGNOSIS — M1711 Unilateral primary osteoarthritis, right knee: Secondary | ICD-10-CM | POA: Diagnosis not present

## 2020-10-18 DIAGNOSIS — M6281 Muscle weakness (generalized): Secondary | ICD-10-CM | POA: Diagnosis not present

## 2020-10-18 DIAGNOSIS — M25661 Stiffness of right knee, not elsewhere classified: Secondary | ICD-10-CM | POA: Diagnosis not present

## 2020-10-18 DIAGNOSIS — R269 Unspecified abnormalities of gait and mobility: Secondary | ICD-10-CM | POA: Diagnosis not present

## 2020-10-22 DIAGNOSIS — M6281 Muscle weakness (generalized): Secondary | ICD-10-CM | POA: Diagnosis not present

## 2020-10-22 DIAGNOSIS — M25661 Stiffness of right knee, not elsewhere classified: Secondary | ICD-10-CM | POA: Diagnosis not present

## 2020-10-22 DIAGNOSIS — R269 Unspecified abnormalities of gait and mobility: Secondary | ICD-10-CM | POA: Diagnosis not present

## 2020-10-29 DIAGNOSIS — M6281 Muscle weakness (generalized): Secondary | ICD-10-CM | POA: Diagnosis not present

## 2020-10-29 DIAGNOSIS — M25661 Stiffness of right knee, not elsewhere classified: Secondary | ICD-10-CM | POA: Diagnosis not present

## 2020-10-29 DIAGNOSIS — R269 Unspecified abnormalities of gait and mobility: Secondary | ICD-10-CM | POA: Diagnosis not present

## 2020-10-29 NOTE — Progress Notes (Signed)
Cardiology Office Note:    Date:  11/02/2020   ID:  Lawrence Gomez, DOB Nov 07, 1945, MRN [id number]  PCP:  Marco Collie, MD  Cardiologist:  Donato Heinz, MD  Electrophysiologist:  None   Referring MD: Marco Collie, MD   Chief Complaint  Patient presents with  . Irregular Heart Beat    History of Present Illness:    Lawrence Gomez is a 75 y.o. male with a hx of OSA, hyperlipidemia who presents for hospital follow-up.  He was admitted to [facility] on 09/28/2020 with wide-complex tachycardia.  He has no known cardiac history, had heart catheterization in 2004 with Dr. Bettina Gavia which reportedly did not show any significant CAD.  He also underwent a stress echo in 2019 following a syncopal episode, which showed normal LV function, no evidence of ischemia.  Episode was felt to be vasovagal syncope.  He underwent knee surgery on 09/25/2020 and has been working with physical therapy at home.  Physical therapy arrived on 09/28/2020 to work with him he was noted to have a heart rate of 200.  He was asymptomatic.  EMS was called and he was given IV lidocaine without improvement.  Adenosine 6 mg was administered and he converted to sinus rhythm.  He denied any symptoms during the episode.  EKG during episode showed rate 180, northwest axis, QRS 180 ms, there is no precordial concordance or typical RBBB/LBBB pattern.  EKG on presentation to the ED shows normal sinus rhythm, rate 93, LVH, isolated 1 mm ST elevation in lead III.  EKG was reviewed with EP, felt likely SVT.  D-dimer was elevated, CTPA was done which showed no evidence of PE.  Echocardiogram on 09/29/2020 showed EF 50 to [id number], grade 1 diastolic dysfunction, normal RV function, mild dilatation of aortic root measuring 40 mm.  He was started on metoprolol 25 mg twice daily and discharged on 12/4 with plans for outpatient cardiac monitor.  Since discharge from the hospital, he reports that he has been doing okay.  Reports has had a couple episodes where he  felt like he was going to pass out.  Has lasted 5 to 10 minutes.  Checked heart rate and was in the 160s to 170s.  Has had multiple near syncopal episodes over the years, but only syncopal episode was 3 to 4 years ago.  States that had sudden onset of lightheadedness and passed out.  Does report he is had some episodes of lightheadedness while driving.  He denies any chest pain or dyspnea.  Past Medical History:  Diagnosis Date  . Aortic atherosclerosis ([place])   . History of cardiac catheterization    Dr. Bettina Gavia, ?2004/2005, no intervention  . Hyperlipidemia   . OSA on CPAP     Past Surgical History:  Procedure Laterality Date  . REPLACEMENT TOTAL KNEE      Current Medications: Current Meds  Medication Sig  . acetaminophen (TYLENOL) 500 MG tablet Take 1,000 mg by mouth 2 (two) times daily as needed for mild pain.  Marland Kitchen aspirin 325 MG EC tablet Take 325 mg by mouth in the morning and at bedtime.  . celecoxib (CELEBREX) 200 MG capsule   . citalopram (CELEXA) 20 MG tablet Take 20 mg by mouth daily.  Marland Kitchen dicyclomine (BENTYL) 20 MG tablet Take 20 mg by mouth 3 (three) times daily. prn  . doxylamine, Sleep, (UNISOM) 25 MG tablet Take 25 mg by mouth at bedtime as needed for sleep.  . metoprolol tartrate (LOPRESSOR) 25 MG tablet  Take 1 tablet (25 mg total) by mouth 2 (two) times daily.  . Multiple Vitamins-Minerals (PRESERVISION AREDS 2+MULTI VIT) CAPS Take 1 capsule by mouth in the morning and at bedtime.  . pantoprazole (PROTONIX) 40 MG tablet Take 40 mg by mouth daily.  . pravastatin (PRAVACHOL) 20 MG tablet Take 20 mg by mouth every other day.  . sennosides-docusate sodium (SENOKOT-S) 8.6-50 MG tablet Take 1 tablet by mouth daily as needed for constipation.     Allergies:   Penicillins   Social History   Socioeconomic History  . Marital status: Married    Spouse name: Not on file  . Number of children: Not on file  . Years of education: Not on file  . Highest education level: Not on  file  Occupational History  . Not on file  Tobacco Use  . Smoking status: Former Research scientist (life sciences)  . Smokeless tobacco: Never Used  Substance and Sexual Activity  . Alcohol use: Not on file  . Drug use: Not on file  . Sexual activity: Not on file  Other Topics Concern  . Not on file  Social History Narrative  . Not on file   Social Determinants of Health   Financial Resource Strain: Not on file  Food Insecurity: Not on file  Transportation Needs: Not on file  Physical Activity: Not on file  Stress: Not on file  Social Connections: Not on file     Family History: The patient's family history includes Hypertension in his father.  ROS:   Please see the history of present illness.     All other systems reviewed and are negative.  EKGs/Labs/Other Studies Reviewed:    The following studies were reviewed today:   EKG:  EKG is  ordered today.  The ekg ordered today demonstrates sinus rhythm, rate 55, first-degree AV block, LVH  Recent Labs: 09/28/2020: ALT 19; Magnesium 2.0; TSH 1.367 09/29/2020: BUN 11; Creatinine, Ser 0.96; Hemoglobin 11.1; Platelets 130; Potassium 3.8; Sodium 138  Recent Lipid Panel    Component Value Date/Time   CHOL 150 09/29/2020 0755   TRIG 72 09/29/2020 0755   HDL 70 09/29/2020 0755   CHOLHDL 2.1 09/29/2020 0755   VLDL 14 09/29/2020 0755   LDLCALC 66 09/29/2020 0755    Physical Exam:    VS:  BP 138/82   Pulse (!) 55   Ht 5\' 11"  (1.803 m)   Wt 178 lb (80.7 kg)   BMI 24.83 kg/m     Wt Readings from Last 3 Encounters:  11/02/20 178 lb (80.7 kg)  09/29/20 179 lb 8 oz (81.4 kg)     GEN:  Well nourished, well developed in no acute distress HEENT: Normal NECK: No JVD; No carotid bruits CARDIAC: RRR, no murmurs, rubs, gallops RESPIRATORY:  Clear to auscultation without rales, wheezing or rhonchi  ABDOMEN: Soft, non-tender, non-distended MUSCULOSKELETAL:  No edema; No deformity  SKIN: Warm and dry NEUROLOGIC:  Alert and oriented x 3 PSYCHIATRIC:   Normal affect   ASSESSMENT:    1. SVT (supraventricular tachycardia) ([place])   2. Near syncope   3. Discoloration of skin of lower leg   4. Cold feet   5. Hyperlipidemia, unspecified hyperlipidemia type    PLAN:    SVT: Presented with wide-complex tachycardia on 09/28/2020, rates up to 200.  Converted to sinus rhythm following adenosine administration.  Reviewed with EP, consistent with SVT.  Echocardiogram showed normal biventricular function, no significant valvular disease.  Zio patch x2 weeks showed 85 episodes of  SVT, longest lasting 3 minutes with rates up to 194 bpm.  He reports he has been having near syncopal episodes -Continue metoprolol 25 mg twice daily, unable to titrate up at this time given resting heart rate in 50s -Given frequent SVT on monitor and patient reporting near syncopal episodes and unable to further titrate metoprolol due to low resting heart rate, recommend referral to EP for evaluation of SVT ablation  Hyperlipidemia: On pravastatin 20 mg daily.  LDL 66 on 09/29/2020  Leg discoloration: reports issues with lower extremity discoloration and always cold.  Will check ABIs  RTC in 6 months   Medication Adjustments/Labs and Tests Ordered: Current medicines are reviewed at length with the patient today.  Concerns regarding medicines are outlined above.  Orders Placed This Encounter  Procedures  . Ambulatory referral to Cardiac Electrophysiology  . EKG 12-Lead  . VAS [place] ABI WITH/WO TBI  . VAS [place] LOWER EXTREMITY ARTERIAL DUPLEX   No orders of the defined types were placed in this encounter.   Patient Instructions  Medication Instructions:  Your physician recommends that you continue on your current medications as directed. Please refer to the Current Medication list given to you today.  *If you need a refill on your cardiac medications before your next appointment, please call your pharmacy*  Testing/Procedures: Your physician has requested that you have  an ankle brachial index (ABI). During this test an ultrasound and blood pressure cuff are used to evaluate the arteries that supply the arms and legs with blood. Allow thirty minutes for this exam. There are no restrictions or special instructions.  Follow-Up: At [facility], you and your health needs are our priority.  As part of our continuing mission to provide you with exceptional heart care, we have created designated Provider Care Teams.  These Care Teams include your primary Cardiologist (physician) and Advanced Practice Providers (APPs -  Physician Assistants and Nurse Practitioners) who all work together to provide you with the care you need, when you need it.  We recommend signing up for the patient portal called "MyChart".  Sign up information is provided on this After Visit Summary.  MyChart is used to connect with patients for Virtual Visits (Telemedicine).  Patients are able to view lab/test results, encounter notes, upcoming appointments, etc.  Non-urgent messages can be sent to your provider as well.   To learn more about what you can do with MyChart, go to [email or web address].    Your next appointment:   6 month(s)  The format for your next appointment:   In Person  Provider:   Epifanio Lesches, MD   Other Instructions You have been referred to: Electrophysiologist --we will call to schedule this appointment      Signed, Little Ishikawa, MD  11/02/2020 8:53 AM    [facility]

## 2020-11-01 DIAGNOSIS — M6281 Muscle weakness (generalized): Secondary | ICD-10-CM | POA: Diagnosis not present

## 2020-11-01 DIAGNOSIS — R269 Unspecified abnormalities of gait and mobility: Secondary | ICD-10-CM | POA: Diagnosis not present

## 2020-11-01 DIAGNOSIS — M25661 Stiffness of right knee, not elsewhere classified: Secondary | ICD-10-CM | POA: Diagnosis not present

## 2020-11-02 ENCOUNTER — Other Ambulatory Visit: Payer: Self-pay

## 2020-11-02 ENCOUNTER — Encounter: Payer: Self-pay | Admitting: Cardiology

## 2020-11-02 ENCOUNTER — Ambulatory Visit: Payer: PPO | Admitting: Cardiology

## 2020-11-02 VITALS — BP 138/82 | HR 55 | Ht 71.0 in | Wt 178.0 lb

## 2020-11-02 DIAGNOSIS — R55 Syncope and collapse: Secondary | ICD-10-CM

## 2020-11-02 DIAGNOSIS — E785 Hyperlipidemia, unspecified: Secondary | ICD-10-CM

## 2020-11-02 DIAGNOSIS — R209 Unspecified disturbances of skin sensation: Secondary | ICD-10-CM | POA: Diagnosis not present

## 2020-11-02 DIAGNOSIS — L819 Disorder of pigmentation, unspecified: Secondary | ICD-10-CM | POA: Diagnosis not present

## 2020-11-02 DIAGNOSIS — I471 Supraventricular tachycardia, unspecified: Secondary | ICD-10-CM

## 2020-11-02 NOTE — Patient Instructions (Signed)
Medication Instructions:  Your physician recommends that you continue on your current medications as directed. Please refer to the Current Medication list given to you today.  *If you need a refill on your cardiac medications before your next appointment, please call your pharmacy*  Testing/Procedures: Your physician has requested that you have an ankle brachial index (ABI). During this test an ultrasound and blood pressure cuff are used to evaluate the arteries that supply the arms and legs with blood. Allow thirty minutes for this exam. There are no restrictions or special instructions.  Follow-Up: At [facility], you and your health needs are our priority.  As part of our continuing mission to provide you with exceptional heart care, we have created designated Provider Care Teams.  These Care Teams include your primary Cardiologist (physician) and Advanced Practice Providers (APPs -  Physician Assistants and Nurse Practitioners) who all work together to provide you with the care you need, when you need it.  We recommend signing up for the patient portal called "MyChart".  Sign up information is provided on this After Visit Summary.  MyChart is used to connect with patients for Virtual Visits (Telemedicine).  Patients are able to view lab/test results, encounter notes, upcoming appointments, etc.  Non-urgent messages can be sent to your provider as well.   To learn more about what you can do with MyChart, go to [email or web address].    Your next appointment:   6 month(s)  The format for your next appointment:   In Person  Provider:   Oswaldo Milian, MD   Other Instructions You have been referred to: Electrophysiologist --we will call to schedule this appointment

## 2020-11-05 DIAGNOSIS — M79604 Pain in right leg: Secondary | ICD-10-CM | POA: Diagnosis not present

## 2020-11-05 DIAGNOSIS — M79661 Pain in right lower leg: Secondary | ICD-10-CM | POA: Diagnosis not present

## 2020-11-05 DIAGNOSIS — Z96651 Presence of right artificial knee joint: Secondary | ICD-10-CM | POA: Diagnosis not present

## 2020-11-05 DIAGNOSIS — M1711 Unilateral primary osteoarthritis, right knee: Secondary | ICD-10-CM | POA: Diagnosis not present

## 2020-11-07 DIAGNOSIS — M1711 Unilateral primary osteoarthritis, right knee: Secondary | ICD-10-CM | POA: Diagnosis not present

## 2020-11-07 DIAGNOSIS — M79661 Pain in right lower leg: Secondary | ICD-10-CM | POA: Diagnosis not present

## 2020-11-08 ENCOUNTER — Encounter (HOSPITAL_COMMUNITY): Payer: Self-pay

## 2020-11-08 ENCOUNTER — Ambulatory Visit (HOSPITAL_COMMUNITY): Payer: PPO

## 2020-11-13 DIAGNOSIS — M79661 Pain in right lower leg: Secondary | ICD-10-CM | POA: Diagnosis not present

## 2020-11-15 DIAGNOSIS — M6281 Muscle weakness (generalized): Secondary | ICD-10-CM | POA: Diagnosis not present

## 2020-11-15 DIAGNOSIS — M25661 Stiffness of right knee, not elsewhere classified: Secondary | ICD-10-CM | POA: Diagnosis not present

## 2020-11-15 DIAGNOSIS — R269 Unspecified abnormalities of gait and mobility: Secondary | ICD-10-CM | POA: Diagnosis not present

## 2020-11-21 DIAGNOSIS — R269 Unspecified abnormalities of gait and mobility: Secondary | ICD-10-CM | POA: Diagnosis not present

## 2020-11-21 DIAGNOSIS — M25661 Stiffness of right knee, not elsewhere classified: Secondary | ICD-10-CM | POA: Diagnosis not present

## 2020-11-21 DIAGNOSIS — M6281 Muscle weakness (generalized): Secondary | ICD-10-CM | POA: Diagnosis not present

## 2020-11-21 NOTE — Progress Notes (Signed)
Electrophysiology Office Note   Date:  11/22/2020   ID:  ELYE HARMSEN, DOB [birth year], MRN [id number]  PCP:  Marco Collie, MD  Cardiologist: Gardiner Rhyme Primary Electrophysiologist:  Deandrae Wajda Meredith Leeds, MD    Chief Complaint: SVT   History of Present Illness: Lawrence Gomez is a 75 y.o. male who is being seen today for the evaluation of SVT at the request of Donato Heinz*. Presenting today for electrophysiology evaluation.  He has a history of obstructive sleep apnea and hyperlipidemia.  He presented to the hospital 09/28/2020 with a wide-complex tachycardia.  He was working with physical therapy at home who noted that his heart rate was at 200 bpm.  EMS was called and gave him IV lidocaine without improvement.  Adenosine 6 mg was administered which converted him to sinus rhythm.  ECG during the episode showed a heart rate of 180 bpm with a northwest axis and a QRS of 180 ms.  ECG on presentation the emergency room showed sinus rhythm.  Since hospital discharge, he has been doing okay, though has had a few episodes where he felt like he was going to pass out.  These episodes last between 5 to 10 minutes.  His heart rates were in the 160s to 170s.  Today, he denies symptoms of palpitations, chest pain, shortness of breath, orthopnea, PND, lower extremity edema, claudication, dizziness, presyncope, syncope, bleeding, or neurologic sequela. The patient is tolerating medications without difficulties.  Today he feels well.  He has no chest pain or shortness of breath.  He does continue to have palpitations.  He was started on metoprolol when he went to the emergency room which improved his symptoms overall.  His palpitations occur much less frequently.   Past Medical History:  Diagnosis Date  . Aortic atherosclerosis ([place])   . History of cardiac catheterization    Dr. Bettina Gavia, ?2004/2005, no intervention  . Hyperlipidemia   . OSA on CPAP    Past Surgical History:  Procedure  Laterality Date  . REPLACEMENT TOTAL KNEE       Current Outpatient Medications  Medication Sig Dispense Refill  . acetaminophen (TYLENOL) 500 MG tablet Take 1,000 mg by mouth 2 (two) times daily as needed for mild pain.    . citalopram (CELEXA) 20 MG tablet Take 20 mg by mouth daily.    Marland Kitchen dicyclomine (BENTYL) 20 MG tablet Take 20 mg by mouth 3 (three) times daily. prn    . doxylamine, Sleep, (UNISOM) 25 MG tablet Take 25 mg by mouth at bedtime as needed for sleep.    . metoprolol tartrate (LOPRESSOR) 25 MG tablet Take 1 tablet (25 mg total) by mouth 2 (two) times daily. 60 tablet 6  . Multiple Vitamins-Minerals (PRESERVISION AREDS 2+MULTI VIT) CAPS Take 1 capsule by mouth in the morning and at bedtime.    . pantoprazole (PROTONIX) 40 MG tablet Take 40 mg by mouth daily.    . pravastatin (PRAVACHOL) 20 MG tablet Take 20 mg by mouth every other day.     No current facility-administered medications for this visit.    Allergies:   Penicillins   Social History:  The patient  reports that he has quit smoking. He has never used smokeless tobacco.   Family History:  The patient's family history includes Hypertension in his father.    ROS:  Please see the history of present illness.   Otherwise, review of systems is positive for none.   All other systems are reviewed and negative.  PHYSICAL EXAM: VS:  BP 118/76   Pulse (!) 57   Ht 5\' 11"  (1.803 m)   Wt 175 lb 3.2 oz (79.5 kg)   SpO2 96%   BMI 24.44 kg/m  , BMI Body mass index is 24.44 kg/m. GEN: Well nourished, well developed, in no acute distress  HEENT: normal  Neck: no JVD, carotid bruits, or masses Cardiac: RRR; no murmurs, rubs, or gallops,no edema  Respiratory:  clear to auscultation bilaterally, normal work of breathing GI: soft, nontender, nondistended, + BS MS: no deformity or atrophy  Skin: warm and dry Neuro:  Strength and sensation are intact Psych: euthymic mood, full affect  EKG:  EKG is ordered  today. Personal review of the ekg ordered shows sinus rhythm, rate 57  Recent Labs: 09/28/2020: ALT 19; Magnesium 2.0; TSH 1.367 09/29/2020: BUN 11; Creatinine, Ser 0.96; Hemoglobin 11.1; Platelets 130; Potassium 3.8; Sodium 138    Lipid Panel     Component Value Date/Time   CHOL 150 09/29/2020 0755   TRIG 72 09/29/2020 0755   HDL 70 09/29/2020 0755   CHOLHDL 2.1 09/29/2020 0755   VLDL 14 09/29/2020 0755   LDLCALC 66 09/29/2020 0755     Wt Readings from Last 3 Encounters:  11/22/20 175 lb 3.2 oz (79.5 kg)  11/02/20 178 lb (80.7 kg)  09/29/20 179 lb 8 oz (81.4 kg)      Other studies Reviewed: Additional studies/ records that were reviewed today include: TTE 09/29/20  Review of the above records today demonstrates:  1. Left ventricular ejection fraction, by estimation, is 50 to 55%. The  left ventricle has low normal function. The left ventricle demonstrates  global hypokinesis. Left ventricular diastolic parameters are consistent  with Grade I diastolic dysfunction  (impaired relaxation).  2. Right ventricular systolic function is normal. The right ventricular  size is normal. Tricuspid regurgitation signal is inadequate for assessing  PA pressure.  3. The mitral valve is normal in structure. No evidence of mitral valve  regurgitation.  4. The aortic valve was not well visualized. Aortic valve regurgitation  is mild to moderate. No aortic stenosis is present.  5. Aortic dilatation noted. There is mild dilatation of the aortic root,  measuring 40 mm. There is mild dilatation of the ascending aorta,  measuring 39 mm.  6. The inferior vena cava is normal in size with greater than 50%  respiratory variability, suggesting right atrial pressure of 3 mmHg.   Cardiac monitor 11/04/2020 personally reviewed  85 episodes of SVT, longest lasting 3 minutes and 17 seconds with average rate 181 bpm  Occasional PVCs (1.1%).   ASSESSMENT AND PLAN:  1.  SVT: Patient presented  to the hospital a wide-complex tachycardia terminated with adenosine.  He wore a 2-week Zio patch which showed multiple episodes of SVT.  He is currently on metoprolol, though unable to titrate this due to resting bradycardia.  At this point, he would likely benefit from ablation as he is continuing to have symptoms.  Risks and benefits of ablation were discussed which were bleeding, tamponade, heart block, stroke, among others.  He recently had knee surgery and is continuing to have issues with bleeding.  At this point he would like to hold off on ablation as he is recently been started on his metoprolol and his symptoms have improved.  We Zakiya Sporrer see him back in 3 months.  Case discussed with primary cardiology   Current medicines are reviewed at length with the patient today.   The  patient does not have concerns regarding his medicines.  The following changes were made today:  none  Labs/ tests ordered today include:  Orders Placed This Encounter  Procedures  . EKG 12-Lead     Disposition:   FU with Bobbi Yount 3 months  Signed, Ednah Hammock Meredith Leeds, MD  11/22/2020 10:26 AM     [facility] [address] [place] [address] [phone] (office) [phone] (fax)

## 2020-11-22 ENCOUNTER — Ambulatory Visit: Payer: PPO | Admitting: Cardiology

## 2020-11-22 ENCOUNTER — Other Ambulatory Visit: Payer: Self-pay

## 2020-11-22 ENCOUNTER — Encounter: Payer: Self-pay | Admitting: Cardiology

## 2020-11-22 VITALS — BP 118/76 | HR 57 | Ht 71.0 in | Wt 175.2 lb

## 2020-11-22 DIAGNOSIS — I471 Supraventricular tachycardia: Secondary | ICD-10-CM

## 2020-11-22 NOTE — Patient Instructions (Signed)
Medication Instructions:  Your physician recommends that you continue on your current medications as directed. Please refer to the Current Medication list given to you today.  *If you need a refill on your cardiac medications before your next appointment, please call your pharmacy*   Lab Work: None ordered   Testing/Procedures: None ordered   Follow-Up: At [facility], you and your health needs are our priority.  As part of our continuing mission to provide you with exceptional heart care, we have created designated Provider Care Teams.  These Care Teams include your primary Cardiologist (physician) and Advanced Practice Providers (APPs -  Physician Assistants and Nurse Practitioners) who all work together to provide you with the care you need, when you need it.  Your next appointment:   3 month(s)  The format for your next appointment:   In Person  Provider:   Will Camnitz, MD    Thank you for choosing [facility]!!   Agustina Witzke, RN [phone]     

## 2020-11-28 DIAGNOSIS — R269 Unspecified abnormalities of gait and mobility: Secondary | ICD-10-CM | POA: Diagnosis not present

## 2020-11-28 DIAGNOSIS — M25661 Stiffness of right knee, not elsewhere classified: Secondary | ICD-10-CM | POA: Diagnosis not present

## 2020-11-28 DIAGNOSIS — M6281 Muscle weakness (generalized): Secondary | ICD-10-CM | POA: Diagnosis not present

## 2020-12-04 DIAGNOSIS — M25661 Stiffness of right knee, not elsewhere classified: Secondary | ICD-10-CM | POA: Diagnosis not present

## 2020-12-04 DIAGNOSIS — R269 Unspecified abnormalities of gait and mobility: Secondary | ICD-10-CM | POA: Diagnosis not present

## 2020-12-04 DIAGNOSIS — M6281 Muscle weakness (generalized): Secondary | ICD-10-CM | POA: Diagnosis not present

## 2020-12-09 DIAGNOSIS — H33012 Retinal detachment with single break, left eye: Secondary | ICD-10-CM | POA: Diagnosis not present

## 2020-12-09 DIAGNOSIS — H33022 Retinal detachment with multiple breaks, left eye: Secondary | ICD-10-CM | POA: Diagnosis not present

## 2020-12-10 DIAGNOSIS — H04123 Dry eye syndrome of bilateral lacrimal glands: Secondary | ICD-10-CM | POA: Diagnosis not present

## 2020-12-10 DIAGNOSIS — H30031 Focal chorioretinal inflammation, peripheral, right eye: Secondary | ICD-10-CM | POA: Diagnosis not present

## 2020-12-10 DIAGNOSIS — H35363 Drusen (degenerative) of macula, bilateral: Secondary | ICD-10-CM | POA: Diagnosis not present

## 2020-12-10 DIAGNOSIS — Z48810 Encounter for surgical aftercare following surgery on the sense organs: Secondary | ICD-10-CM | POA: Diagnosis not present

## 2020-12-10 DIAGNOSIS — H33012 Retinal detachment with single break, left eye: Secondary | ICD-10-CM | POA: Diagnosis not present

## 2020-12-10 DIAGNOSIS — Z79899 Other long term (current) drug therapy: Secondary | ICD-10-CM | POA: Diagnosis not present

## 2020-12-10 DIAGNOSIS — H35351 Cystoid macular degeneration, right eye: Secondary | ICD-10-CM | POA: Diagnosis not present

## 2020-12-10 DIAGNOSIS — H35063 Retinal vasculitis, bilateral: Secondary | ICD-10-CM | POA: Diagnosis not present

## 2021-01-01 IMAGING — CT CT ANGIO CHEST
2 of 7 series · 18 of 46 positions shown · IV contrast (APPLIED)
Comparison: 04/20/2018

CLINICAL DATA: Recent knee replacement, now with elevated D-dimer
and tachycardia. Evaluate for pulmonary embolus.

EXAM:
CT ANGIOGRAPHY CHEST WITH CONTRAST
TECHNIQUE: Multidetector CT imaging of the chest was performed using the
standard protocol during bolus administration of intravenous
contrast. Multiplanar CT image reconstructions and MIPs were
obtained to evaluate the vascular anatomy.
CONTRAST:  80mL OMNIPAQUE IOHEXOL 350 MG/ML SOLN

[Series 7: thins · axial · 0.72mm/px · z∈[-277,-27]mm · 15 of 404 slices shown]
[im 23/404  lung]
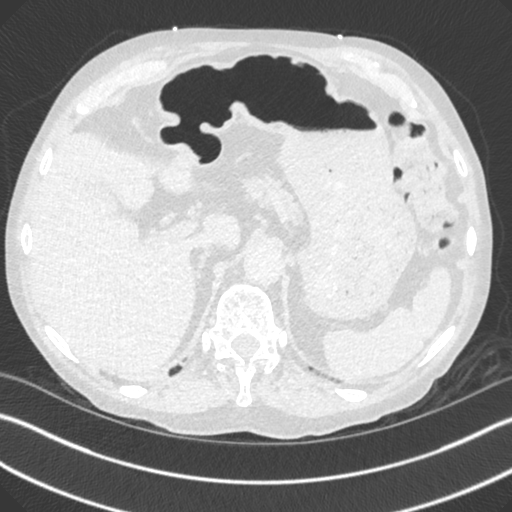
[im 45/404  soft-tissue]
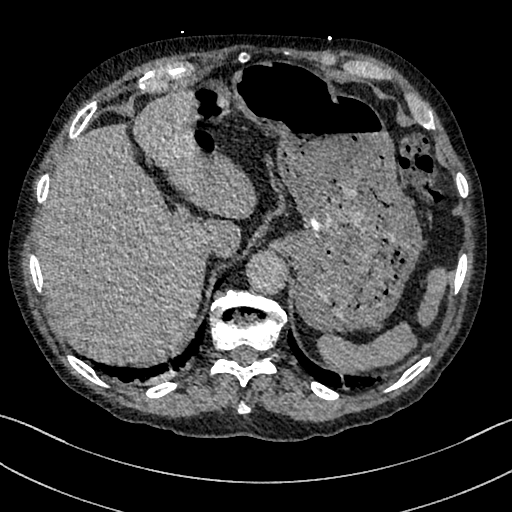
[im 68/404  lung]
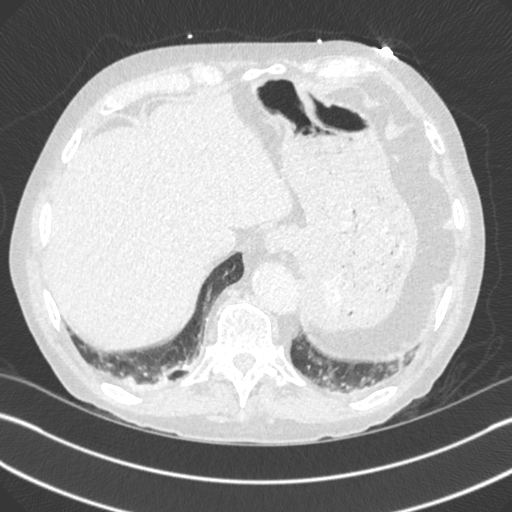
[im 90/404  soft-tissue]
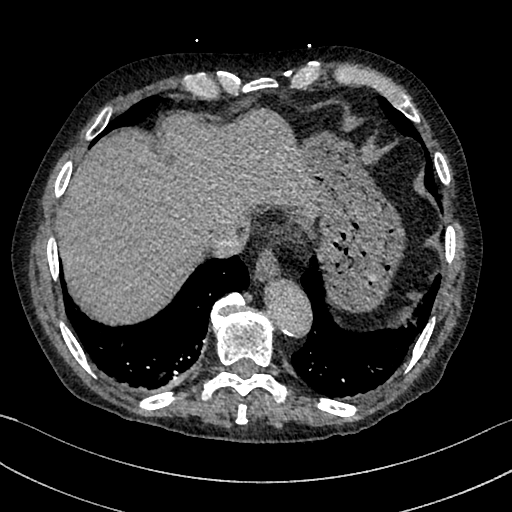
[im 135/404  lung]
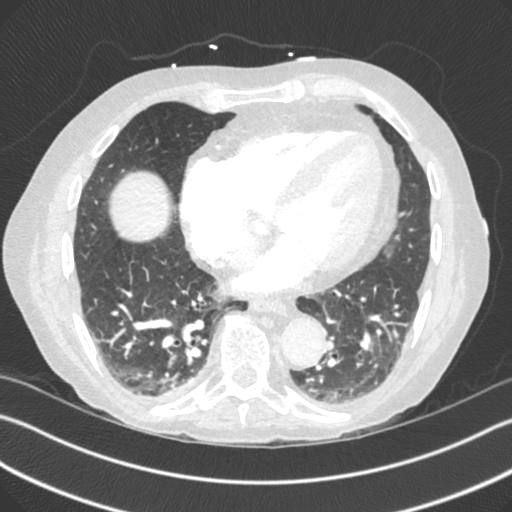
[im 157/404  soft-tissue]
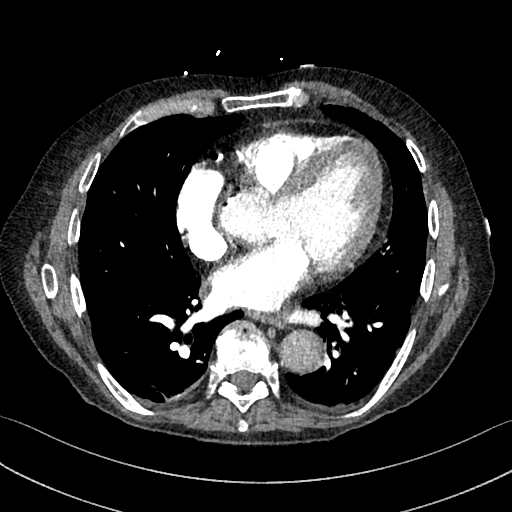
[im 180/404  lung]
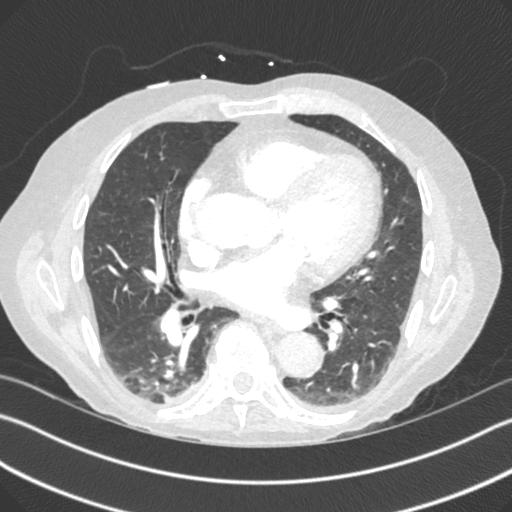
[im 202/404  soft-tissue]
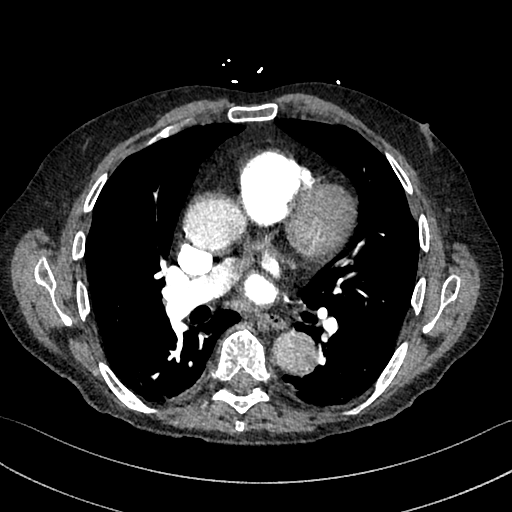
[im 224/404  lung]
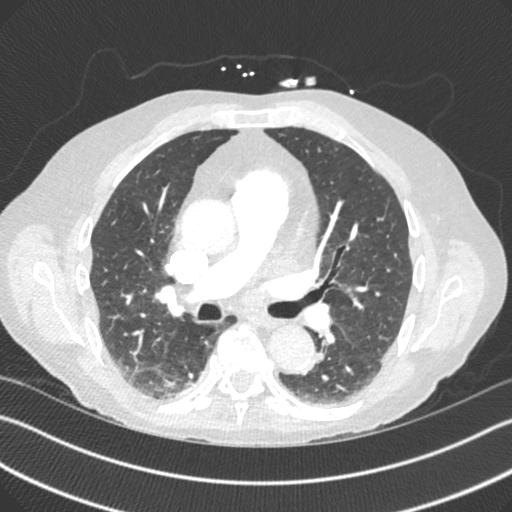
[im 247/404  soft-tissue]
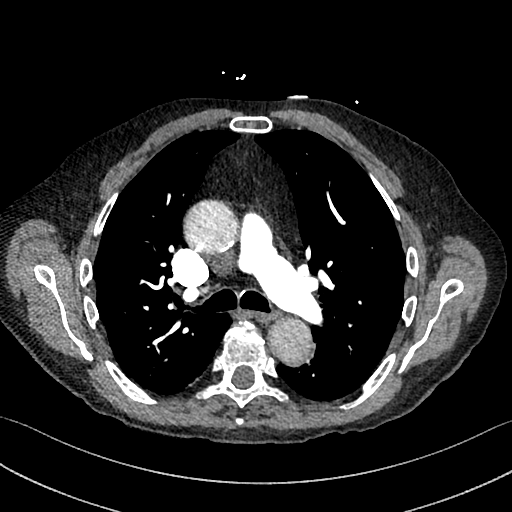
[im 269/404  lung]
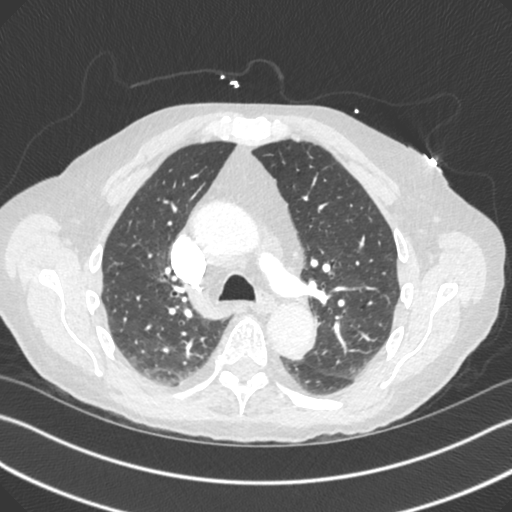
[im 314/404  soft-tissue]
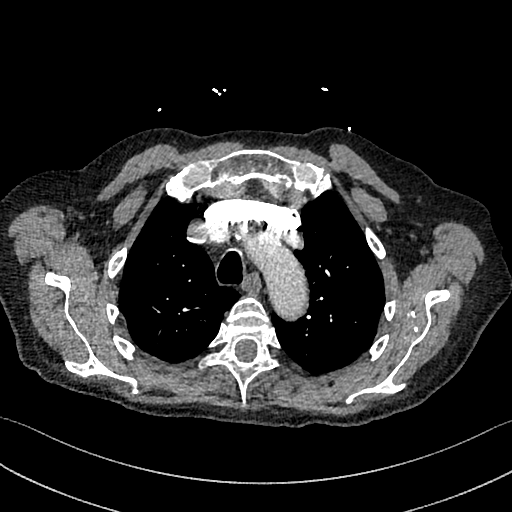
[im 336/404  lung]
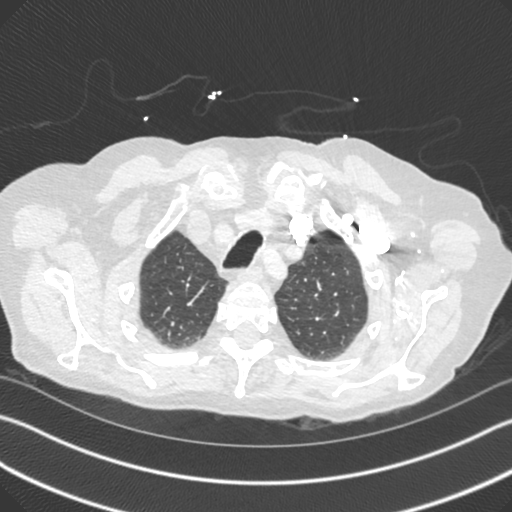
[im 359/404  soft-tissue]
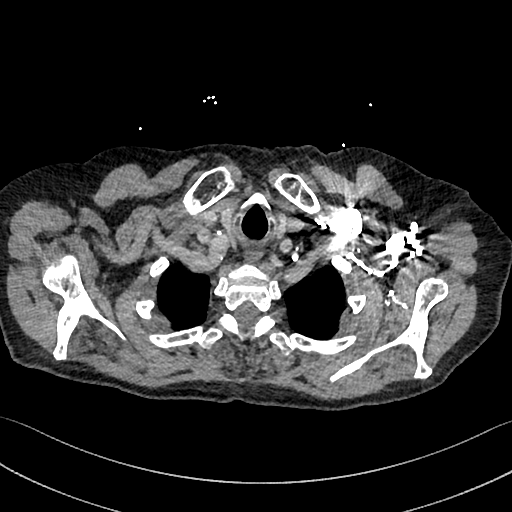
[im 381/404  lung]
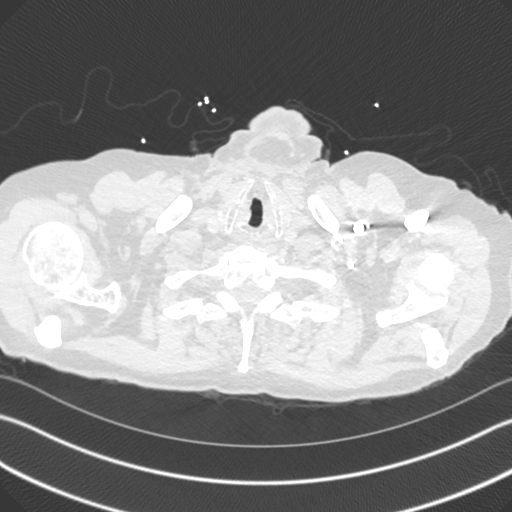

[Series 8: cor · coronal · 0.55mm/px · 3 of 133 slices shown]
[im 34/133  soft-tissue]
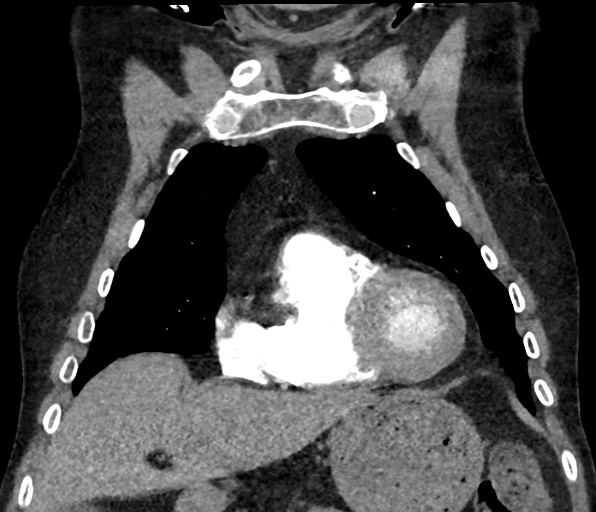
[im 67/133  soft-tissue]
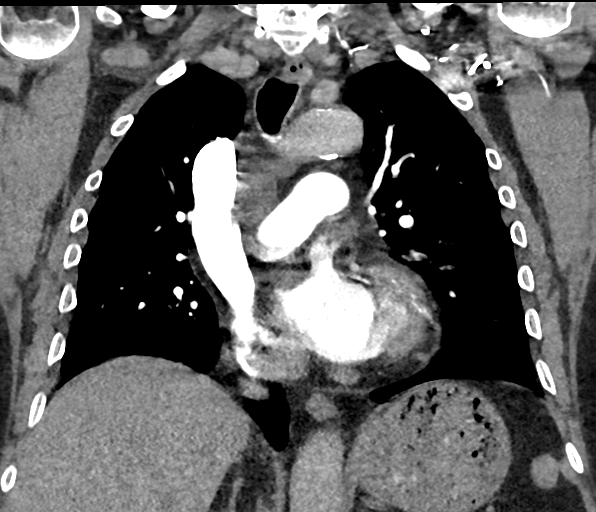
[im 100/133  soft-tissue]
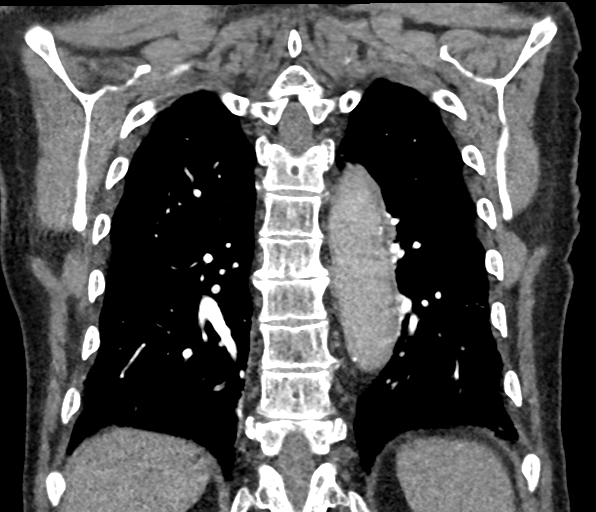

[18 of 46 positions shown; findings below may reference images not displayed]

FINDINGS: Cardiovascular: Heart is enlarged. Coronary artery calcification is
evident. Atherosclerotic calcification is noted in the wall of the
thoracic aorta. There is no filling defect within the opacified
pulmonary arteries to suggest the presence of an acute pulmonary
embolus.

Mediastinum/Nodes: No mediastinal lymphadenopathy. There is no hilar
lymphadenopathy. The esophagus has normal imaging features. There is
no axillary lymphadenopathy.

Lungs/Pleura: Right middle lobe calcified granuloma. 5 mm right
lower lobe nodule on 86/6 is similar to prior given motion artifact
on the previous exam. Cyst no new suspicious pulmonary nodule or
mass. No focal airspace consolidation. Minimal atelectasis noted in
the dependent lung base No pleural effusion. s.

Upper Abdomen: Unremarkable.

Musculoskeletal: No worrisome lytic or sclerotic osseous
abnormality.

Review of the MIP images confirms the above findings.
IMPRESSION: 1. No CT evidence for acute pulmonary embolus.
2. Stable 5 mm right lower lobe pulmonary nodule. Given the greater
than 2 year stability, this is consistent with benign etiology.
3. Aortic Atherosclerosis ([id number]).

## 2021-01-15 DIAGNOSIS — Z8669 Personal history of other diseases of the nervous system and sense organs: Secondary | ICD-10-CM | POA: Diagnosis not present

## 2021-01-15 DIAGNOSIS — H35063 Retinal vasculitis, bilateral: Secondary | ICD-10-CM | POA: Diagnosis not present

## 2021-01-15 DIAGNOSIS — H33012 Retinal detachment with single break, left eye: Secondary | ICD-10-CM | POA: Diagnosis not present

## 2021-01-15 DIAGNOSIS — H35351 Cystoid macular degeneration, right eye: Secondary | ICD-10-CM | POA: Diagnosis not present

## 2021-01-15 DIAGNOSIS — H30031 Focal chorioretinal inflammation, peripheral, right eye: Secondary | ICD-10-CM | POA: Diagnosis not present

## 2021-01-15 DIAGNOSIS — H04123 Dry eye syndrome of bilateral lacrimal glands: Secondary | ICD-10-CM | POA: Diagnosis not present

## 2021-01-15 DIAGNOSIS — Z961 Presence of intraocular lens: Secondary | ICD-10-CM | POA: Diagnosis not present

## 2021-01-15 DIAGNOSIS — Z79899 Other long term (current) drug therapy: Secondary | ICD-10-CM | POA: Diagnosis not present

## 2021-01-15 DIAGNOSIS — Z9889 Other specified postprocedural states: Secondary | ICD-10-CM | POA: Diagnosis not present

## 2021-01-15 DIAGNOSIS — Z48810 Encounter for surgical aftercare following surgery on the sense organs: Secondary | ICD-10-CM | POA: Diagnosis not present

## 2021-01-15 DIAGNOSIS — H35363 Drusen (degenerative) of macula, bilateral: Secondary | ICD-10-CM | POA: Diagnosis not present

## 2021-01-24 DIAGNOSIS — M1711 Unilateral primary osteoarthritis, right knee: Secondary | ICD-10-CM | POA: Diagnosis not present

## 2021-01-24 DIAGNOSIS — M79661 Pain in right lower leg: Secondary | ICD-10-CM | POA: Diagnosis not present

## 2021-01-29 DIAGNOSIS — E785 Hyperlipidemia, unspecified: Secondary | ICD-10-CM | POA: Diagnosis not present

## 2021-01-29 DIAGNOSIS — D692 Other nonthrombocytopenic purpura: Secondary | ICD-10-CM | POA: Diagnosis not present

## 2021-01-29 DIAGNOSIS — F419 Anxiety disorder, unspecified: Secondary | ICD-10-CM | POA: Diagnosis not present

## 2021-01-29 DIAGNOSIS — Z9889 Other specified postprocedural states: Secondary | ICD-10-CM | POA: Diagnosis not present

## 2021-01-31 DIAGNOSIS — E785 Hyperlipidemia, unspecified: Secondary | ICD-10-CM | POA: Diagnosis not present

## 2021-02-26 ENCOUNTER — Ambulatory Visit: Payer: PPO | Admitting: Cardiology

## 2021-02-26 DIAGNOSIS — H35363 Drusen (degenerative) of macula, bilateral: Secondary | ICD-10-CM | POA: Diagnosis not present

## 2021-02-26 DIAGNOSIS — Z8669 Personal history of other diseases of the nervous system and sense organs: Secondary | ICD-10-CM | POA: Diagnosis not present

## 2021-02-26 DIAGNOSIS — Z79899 Other long term (current) drug therapy: Secondary | ICD-10-CM | POA: Diagnosis not present

## 2021-02-26 DIAGNOSIS — H35351 Cystoid macular degeneration, right eye: Secondary | ICD-10-CM | POA: Diagnosis not present

## 2021-02-26 DIAGNOSIS — H33012 Retinal detachment with single break, left eye: Secondary | ICD-10-CM | POA: Diagnosis not present

## 2021-02-26 DIAGNOSIS — H04123 Dry eye syndrome of bilateral lacrimal glands: Secondary | ICD-10-CM | POA: Diagnosis not present

## 2021-02-26 DIAGNOSIS — H30031 Focal chorioretinal inflammation, peripheral, right eye: Secondary | ICD-10-CM | POA: Diagnosis not present

## 2021-02-26 DIAGNOSIS — Z961 Presence of intraocular lens: Secondary | ICD-10-CM | POA: Diagnosis not present

## 2021-02-26 DIAGNOSIS — H35063 Retinal vasculitis, bilateral: Secondary | ICD-10-CM | POA: Diagnosis not present

## 2021-02-27 DIAGNOSIS — Z139 Encounter for screening, unspecified: Secondary | ICD-10-CM | POA: Diagnosis not present

## 2021-02-27 DIAGNOSIS — Z7189 Other specified counseling: Secondary | ICD-10-CM | POA: Diagnosis not present

## 2021-02-27 DIAGNOSIS — Z0000 Encounter for general adult medical examination without abnormal findings: Secondary | ICD-10-CM | POA: Diagnosis not present

## 2021-02-27 DIAGNOSIS — Z1331 Encounter for screening for depression: Secondary | ICD-10-CM | POA: Diagnosis not present

## 2021-02-27 DIAGNOSIS — Z136 Encounter for screening for cardiovascular disorders: Secondary | ICD-10-CM | POA: Diagnosis not present

## 2021-02-27 DIAGNOSIS — Z1339 Encounter for screening examination for other mental health and behavioral disorders: Secondary | ICD-10-CM | POA: Diagnosis not present

## 2021-03-03 NOTE — Progress Notes (Signed)
PCP:  Marco Collie, MD Primary Cardiologist: Donato Heinz, MD Electrophysiologist: Dr. Sheliah Mends Lawrence Gomez is a 75 y.o. male seen today for Dr. Curt Bears for routine electrophysiology followup.  Since last being seen in our clinic the patient reports doing overall about the same. He continues to have tachy-palpitations about once a week. At worst, he has had 3-4 episodes in 1 day, within 2-3 hours. They occur most often in the am but can also occur in the afternoons. He has 2-3 cups of coffee in the am. Typically these episodes last 1-1.5 minutes. He has seldomly felt lightheadedness along with them. Has had to pull over before. he denies chest pain, dyspnea, PND, orthopnea, nausea, vomiting, dizziness, syncope, edema, weight gain, or early satiety.  Past Medical History:  Diagnosis Date  . Aortic atherosclerosis ([place])   . History of cardiac catheterization    Dr. Bettina Gavia, ?2004/2005, no intervention  . Hyperlipidemia   . OSA on CPAP    Past Surgical History:  Procedure Laterality Date  . REPLACEMENT TOTAL KNEE      Current Outpatient Medications  Medication Sig Dispense Refill  . acetaminophen (TYLENOL) 500 MG tablet Take 1,000 mg by mouth 2 (two) times daily as needed for mild pain.    . Bromfenac Sodium 0.09 % SOLN 1 drop daily    . citalopram (CELEXA) 20 MG tablet Take 20 mg by mouth daily.    Marland Kitchen doxylamine, Sleep, (UNISOM) 25 MG tablet Take 25 mg by mouth at bedtime as needed for sleep.    . metoprolol tartrate (LOPRESSOR) 25 MG tablet Take 1 tablet (25 mg total) by mouth 2 (two) times daily. 60 tablet 6  . Multiple Vitamins-Minerals (PRESERVISION AREDS 2+MULTI VIT) CAPS Take 1 capsule by mouth daily.    . pantoprazole (PROTONIX) 40 MG tablet Take 40 mg by mouth daily.    . pravastatin (PRAVACHOL) 20 MG tablet Take 20 mg by mouth every other day.    . prednisoLONE acetate (PRED FORTE) 1 % ophthalmic suspension Place 1 drop into the right eye 4 times daily.     No  current facility-administered medications for this visit.    Allergies  Allergen Reactions  . Penicillins Hives    Social History   Socioeconomic History  . Marital status: Married    Spouse name: Not on file  . Number of children: Not on file  . Years of education: Not on file  . Highest education level: Not on file  Occupational History  . Not on file  Tobacco Use  . Smoking status: Former Research scientist (life sciences)  . Smokeless tobacco: Never Used  Substance and Sexual Activity  . Alcohol use: Not on file  . Drug use: Not on file  . Sexual activity: Not on file  Other Topics Concern  . Not on file  Social History Narrative  . Not on file   Social Determinants of Health   Financial Resource Strain: Not on file  Food Insecurity: Not on file  Transportation Needs: Not on file  Physical Activity: Not on file  Stress: Not on file  Social Connections: Not on file  Intimate Partner Violence: Not on file     Review of Systems: General: No chills, fever, night sweats or weight changes  Cardiovascular:  No chest pain, dyspnea on exertion, edema, orthopnea, palpitations, paroxysmal nocturnal dyspnea Dermatological: No rash, lesions or masses Respiratory: No cough, dyspnea Urologic: No hematuria, dysuria Abdominal: No nausea, vomiting, diarrhea, bright red blood per rectum, melena,  or hematemesis Neurologic: No visual changes, weakness, changes in mental status All other systems reviewed and are otherwise negative except as noted above.  Physical Exam: Vitals:   03/04/21 1206  BP: 124/72  Pulse: 65  SpO2: 98%  Weight: 180 lb (81.6 kg)  Height: 5\' 11"  (1.803 m)    GEN- The patient is well appearing, alert and oriented x 3 today.   HEENT: normocephalic, atraumatic; sclera clear, conjunctiva pink; hearing intact; oropharynx clear; neck supple, no JVP Lymph- no cervical lymphadenopathy Lungs- Clear to ausculation bilaterally, normal work of breathing.  No wheezes, rales,  rhonchi Heart- Regular rate and rhythm, no murmurs, rubs or gallops, PMI not laterally displaced GI- soft, non-tender, non-distended, bowel sounds present, no hepatosplenomegaly Extremities- no clubbing, cyanosis, or edema; DP/PT/radial pulses 2+ bilaterally MS- no significant deformity or atrophy Skin- warm and dry, no rash or lesion Psych- euthymic mood, full affect Neuro- strength and sensation are intact  EKG is not ordered. Additional studies reviewed include: Previous EP office notes  Assessment and Plan:  1. SVT Previously had WCT terminated wth adenosinde 2 week Zio showed SVT Metoprolol has managed symptoms, but titration limiting by bradycardia. He would be a candidate for ablation per Dr. Curt Bears previous notes.  Pt continues to feel like his symptoms do not warrant ablation consideration. He would like to follow up with Dr. Curt Bears again in 3-4 months in [place] to see how he does/feels over the summer.   Pt understands to call sooner with any worsening symptoms.   Shirley Friar, PA-C  03/04/21 2:09 PM

## 2021-03-04 ENCOUNTER — Ambulatory Visit: Payer: PPO | Admitting: Student

## 2021-03-04 ENCOUNTER — Encounter: Payer: Self-pay | Admitting: Student

## 2021-03-04 ENCOUNTER — Other Ambulatory Visit: Payer: Self-pay

## 2021-03-04 VITALS — BP 124/72 | HR 65 | Ht 71.0 in | Wt 180.0 lb

## 2021-03-04 DIAGNOSIS — I471 Supraventricular tachycardia: Secondary | ICD-10-CM | POA: Diagnosis not present

## 2021-03-04 NOTE — Patient Instructions (Signed)

## 2021-03-11 DIAGNOSIS — M1711 Unilateral primary osteoarthritis, right knee: Secondary | ICD-10-CM | POA: Diagnosis not present

## 2021-04-30 ENCOUNTER — Other Ambulatory Visit: Payer: Self-pay

## 2021-04-30 NOTE — Telephone Encounter (Signed)
This is Dr. Schumann's pt 

## 2021-05-02 ENCOUNTER — Other Ambulatory Visit: Payer: Self-pay

## 2021-05-02 MED ORDER — METOPROLOL TARTRATE 25 MG PO TABS: 25.0000 mg | ORAL_TABLET | Freq: Two times a day (BID) | ORAL | 11 refills | Status: DC

## 2021-05-02 NOTE — Telephone Encounter (Signed)
Pt's medication was sent to pt's pharmacy as requested. Confirmation received.  °

## 2021-05-07 DIAGNOSIS — Z8669 Personal history of other diseases of the nervous system and sense organs: Secondary | ICD-10-CM | POA: Diagnosis not present

## 2021-05-07 DIAGNOSIS — H35351 Cystoid macular degeneration, right eye: Secondary | ICD-10-CM | POA: Diagnosis not present

## 2021-05-07 DIAGNOSIS — Z961 Presence of intraocular lens: Secondary | ICD-10-CM | POA: Diagnosis not present

## 2021-05-07 DIAGNOSIS — H35363 Drusen (degenerative) of macula, bilateral: Secondary | ICD-10-CM | POA: Diagnosis not present

## 2021-05-07 DIAGNOSIS — H04123 Dry eye syndrome of bilateral lacrimal glands: Secondary | ICD-10-CM | POA: Diagnosis not present

## 2021-05-07 DIAGNOSIS — H30031 Focal chorioretinal inflammation, peripheral, right eye: Secondary | ICD-10-CM | POA: Diagnosis not present

## 2021-05-07 DIAGNOSIS — H33012 Retinal detachment with single break, left eye: Secondary | ICD-10-CM | POA: Diagnosis not present

## 2021-05-07 DIAGNOSIS — Z79899 Other long term (current) drug therapy: Secondary | ICD-10-CM | POA: Diagnosis not present

## 2021-05-07 DIAGNOSIS — H35063 Retinal vasculitis, bilateral: Secondary | ICD-10-CM | POA: Diagnosis not present

## 2021-05-08 DIAGNOSIS — G4733 Obstructive sleep apnea (adult) (pediatric): Secondary | ICD-10-CM | POA: Diagnosis not present

## 2021-05-14 ENCOUNTER — Telehealth: Payer: Self-pay

## 2021-05-14 NOTE — Telephone Encounter (Signed)
Called Pharmacy to change pt's metoprolol 25mg  to a 90 day supply. Hiram Comber

## 2021-05-15 MED ORDER — METOPROLOL TARTRATE 25 MG PO TABS: 25.0000 mg | ORAL_TABLET | Freq: Two times a day (BID) | ORAL | 3 refills | Status: DC

## 2021-05-15 NOTE — Addendum Note (Signed)
Addended by: Gaetano Net on: 05/15/2021 08:17 AM   Modules accepted: Orders

## 2021-07-08 ENCOUNTER — Ambulatory Visit: Payer: PPO | Admitting: Cardiology

## 2021-07-08 ENCOUNTER — Encounter: Payer: Self-pay | Admitting: Cardiology

## 2021-07-08 ENCOUNTER — Other Ambulatory Visit: Payer: Self-pay

## 2021-07-08 VITALS — BP 126/74 | HR 52 | Ht 71.0 in | Wt 182.2 lb

## 2021-07-08 DIAGNOSIS — I471 Supraventricular tachycardia: Secondary | ICD-10-CM

## 2021-07-08 NOTE — Patient Instructions (Signed)
Medication Instructions:  Your physician recommends that you continue on your current medications as directed. Please refer to the Current Medication list given to you today.  *If you need a refill on your cardiac medications before your next appointment, please call your pharmacy*   Lab Work: None ordered   Testing/Procedures: None ordered   Follow-Up: At [facility], you and your health needs are our priority.  As part of our continuing mission to provide you with exceptional heart care, we have created designated Provider Care Teams.  These Care Teams include your primary Cardiologist (physician) and Advanced Practice Providers (APPs -  Physician Assistants and Nurse Practitioners) who all work together to provide you with the care you need, when you need it.  Your next appointment:   3 month(s)  The format for your next appointment:   In Person  Provider:   Will Camnitz, MD    Thank you for choosing [facility]!!   Ariana Cavenaugh, RN [phone]     

## 2021-07-08 NOTE — Progress Notes (Signed)
Electrophysiology Office Note   Date:  07/08/2021   ID:  Lawrence Gomez, DOB [birth year], MRN [id number]  PCP:  Marco Collie, MD  Cardiologist: Gardiner Rhyme Primary Electrophysiologist:  Brendan Gruwell Meredith Leeds, MD    Chief Complaint: SVT   History of Present Illness: Lawrence Gomez is a 75 y.o. male who is being seen today for the evaluation of SVT at the request of Marco Collie, MD. Presenting today for electrophysiology evaluation.  He has a history significant for obstructive sleep apnea and hyperlipidemia.  He presented to the hospital 09/28/2020 with wide-complex tachycardia.  He was working with physical therapy at home and was noted to have heart rates of 200 bpm.  EMS was called and gave him IV lidocaine without improvement.  Adenosine 6 mg converted him to sinus rhythm.  ECG during the episode showed a heart rate of 180 bpm with a northwest axis and a QRS of 180 ms.  ECG on presentation the emergency room showed sinus rhythm.  He was started on metoprolol.  Today, denies symptoms of chest pain, shortness of breath, orthopnea, PND, lower extremity edema, claudication, dizziness, presyncope, syncope, bleeding, or neurologic sequela. The patient is tolerating medications without difficulties.  He has continued to have episodes of palpitations.  He has an apple watch with recordings showing SVT, heart rates between 145-176 with a narrow QRS complex.  When he has these episodes, he feels nauseous prior to tachycardia.  He has palpitations and some shortness of breath associated with his episodes of tachycardia.  He feels that his tachycardia episodes occur when he does not eat.  In between his episodes, he feels well and is able to do all of his daily activities.   Past Medical History:  Diagnosis Date   Aortic atherosclerosis ([place])    History of cardiac catheterization    Dr. Bettina Gavia, ?2004/2005, no intervention   Hyperlipidemia    OSA on CPAP    Past Surgical History:  Procedure Laterality  Date   REPLACEMENT TOTAL KNEE       Current Outpatient Medications  Medication Sig Dispense Refill   acetaminophen (TYLENOL) 500 MG tablet Take 1,000 mg by mouth 2 (two) times daily as needed for mild pain.     Bromfenac Sodium 0.09 % SOLN 1 drop daily     citalopram (CELEXA) 20 MG tablet Take 20 mg by mouth daily.     doxylamine, Sleep, (UNISOM) 25 MG tablet Take 25 mg by mouth at bedtime as needed for sleep.     metoprolol tartrate (LOPRESSOR) 25 MG tablet Take 1 tablet (25 mg total) by mouth 2 (two) times daily. 180 tablet 3   pantoprazole (PROTONIX) 40 MG tablet Take 40 mg by mouth daily.     pravastatin (PRAVACHOL) 20 MG tablet Take 20 mg by mouth every other day.     No current facility-administered medications for this visit.    Allergies:   Penicillins   Social History:  The patient  reports that he has quit smoking. He has never used smokeless tobacco.   Family History:  The patient's family history includes Hypertension in his father.   ROS:  Please see the history of present illness.   Otherwise, review of systems is positive for none.   All other systems are reviewed and negative.   PHYSICAL EXAM: VS:  BP 126/74   Pulse (!) 52   Ht '5\' 11"'$  (1.803 m)   Wt 182 lb 3.2 oz (82.6 kg)   SpO2 97%  BMI 25.41 kg/m  , BMI Body mass index is 25.41 kg/m. GEN: Well nourished, well developed, in no acute distress  HEENT: normal  Neck: no JVD, carotid bruits, or masses Cardiac: RRR; no murmurs, rubs, or gallops,no edema  Respiratory:  clear to auscultation bilaterally, normal work of breathing GI: soft, nontender, nondistended, + BS MS: no deformity or atrophy  Skin: warm and dry Neuro:  Strength and sensation are intact Psych: euthymic mood, full affect  EKG:  EKG is ordered today. Personal review of the ekg ordered shows sinus rhythm, first-degree AV block, left anterior fascicular block  Recent Labs: 09/28/2020: ALT 19; Magnesium 2.0; TSH 1.367 09/29/2020: BUN 11;  Creatinine, Ser 0.96; Hemoglobin 11.1; Platelets 130; Potassium 3.8; Sodium 138    Lipid Panel     Component Value Date/Time   CHOL 150 09/29/2020 0755   TRIG 72 09/29/2020 0755   HDL 70 09/29/2020 0755   CHOLHDL 2.1 09/29/2020 0755   VLDL 14 09/29/2020 0755   LDLCALC 66 09/29/2020 0755     Wt Readings from Last 3 Encounters:  07/08/21 182 lb 3.2 oz (82.6 kg)  03/04/21 180 lb (81.6 kg)  11/22/20 175 lb 3.2 oz (79.5 kg)      Other studies Reviewed: Additional studies/ records that were reviewed today include: TTE 09/29/20  Review of the above records today demonstrates:   1. Left ventricular ejection fraction, by estimation, is 50 to 55%. The  left ventricle has low normal function. The left ventricle demonstrates  global hypokinesis. Left ventricular diastolic parameters are consistent  with Grade I diastolic dysfunction  (impaired relaxation).   2. Right ventricular systolic function is normal. The right ventricular  size is normal. Tricuspid regurgitation signal is inadequate for assessing  PA pressure.   3. The mitral valve is normal in structure. No evidence of mitral valve  regurgitation.   4. The aortic valve was not well visualized. Aortic valve regurgitation  is mild to moderate. No aortic stenosis is present.   5. Aortic dilatation noted. There is mild dilatation of the aortic root,  measuring 40 mm. There is mild dilatation of the ascending aorta,  measuring 39 mm.   6. The inferior vena cava is normal in size with greater than 50%  respiratory variability, suggesting right atrial pressure of 3 mmHg.   Cardiac monitor 11/04/2020 personally reviewed 85 episodes of SVT, longest lasting 3 minutes and 17 seconds with average rate 181 bpm Occasional PVCs (1.1%).   ASSESSMENT AND PLAN:  1.  SVT: Patient presented to the hospital with a wide-complex tachycardia terminated with adenosine.  He wore a 2-week Zio patch that showed multiple episodes of SVT.  He is  currently on metoprolol 25 mg twice daily, though unable to adjust due to resting bradycardia.  He continues to have episodes of SVT.  We discussed ablation and whether or not that would be reasonable at this time.  He feels that he is able to control his episodes well.  He is not quite ready for ablation yet.  We Yesenia Fontenette hold off for now.   Current medicines are reviewed at length with the patient today.   The patient does not have concerns regarding his medicines.  The following changes were made today: None  Labs/ tests ordered today include:  Orders Placed This Encounter  Procedures   EKG 12-Lead      Disposition:   FU with Kenza Munar 6 months  Signed, Amarie Viles Meredith Leeds, MD  07/08/2021 10:43 AM  [place] [address] [phone] (office) [phone] (fax)

## 2021-07-12 DIAGNOSIS — H35363 Drusen (degenerative) of macula, bilateral: Secondary | ICD-10-CM | POA: Diagnosis not present

## 2021-07-12 DIAGNOSIS — H35351 Cystoid macular degeneration, right eye: Secondary | ICD-10-CM | POA: Diagnosis not present

## 2021-07-12 DIAGNOSIS — H35063 Retinal vasculitis, bilateral: Secondary | ICD-10-CM | POA: Diagnosis not present

## 2021-07-12 DIAGNOSIS — Z79899 Other long term (current) drug therapy: Secondary | ICD-10-CM | POA: Diagnosis not present

## 2021-07-12 DIAGNOSIS — H33012 Retinal detachment with single break, left eye: Secondary | ICD-10-CM | POA: Diagnosis not present

## 2021-07-12 DIAGNOSIS — Z961 Presence of intraocular lens: Secondary | ICD-10-CM | POA: Diagnosis not present

## 2021-07-12 DIAGNOSIS — H04123 Dry eye syndrome of bilateral lacrimal glands: Secondary | ICD-10-CM | POA: Diagnosis not present

## 2021-07-12 DIAGNOSIS — H30031 Focal chorioretinal inflammation, peripheral, right eye: Secondary | ICD-10-CM | POA: Diagnosis not present

## 2021-07-12 DIAGNOSIS — Z8669 Personal history of other diseases of the nervous system and sense organs: Secondary | ICD-10-CM | POA: Diagnosis not present

## 2021-07-31 DIAGNOSIS — M19012 Primary osteoarthritis, left shoulder: Secondary | ICD-10-CM | POA: Diagnosis not present

## 2021-07-31 DIAGNOSIS — M7552 Bursitis of left shoulder: Secondary | ICD-10-CM | POA: Diagnosis not present

## 2021-07-31 DIAGNOSIS — M25512 Pain in left shoulder: Secondary | ICD-10-CM | POA: Diagnosis not present

## 2021-08-23 DIAGNOSIS — Z961 Presence of intraocular lens: Secondary | ICD-10-CM | POA: Diagnosis not present

## 2021-08-23 DIAGNOSIS — H35363 Drusen (degenerative) of macula, bilateral: Secondary | ICD-10-CM | POA: Diagnosis not present

## 2021-08-23 DIAGNOSIS — Z79899 Other long term (current) drug therapy: Secondary | ICD-10-CM | POA: Diagnosis not present

## 2021-08-23 DIAGNOSIS — H33012 Retinal detachment with single break, left eye: Secondary | ICD-10-CM | POA: Diagnosis not present

## 2021-08-23 DIAGNOSIS — Z8669 Personal history of other diseases of the nervous system and sense organs: Secondary | ICD-10-CM | POA: Diagnosis not present

## 2021-08-23 DIAGNOSIS — H35351 Cystoid macular degeneration, right eye: Secondary | ICD-10-CM | POA: Diagnosis not present

## 2021-08-23 DIAGNOSIS — H04123 Dry eye syndrome of bilateral lacrimal glands: Secondary | ICD-10-CM | POA: Diagnosis not present

## 2021-08-23 DIAGNOSIS — H30033 Focal chorioretinal inflammation, peripheral, bilateral: Secondary | ICD-10-CM | POA: Diagnosis not present

## 2021-08-23 DIAGNOSIS — H3581 Retinal edema: Secondary | ICD-10-CM | POA: Diagnosis not present

## 2021-08-23 DIAGNOSIS — H35063 Retinal vasculitis, bilateral: Secondary | ICD-10-CM | POA: Diagnosis not present

## 2021-09-03 DIAGNOSIS — I700 Atherosclerosis of aorta: Secondary | ICD-10-CM | POA: Diagnosis not present

## 2021-09-03 DIAGNOSIS — E785 Hyperlipidemia, unspecified: Secondary | ICD-10-CM | POA: Diagnosis not present

## 2021-09-03 DIAGNOSIS — F419 Anxiety disorder, unspecified: Secondary | ICD-10-CM | POA: Diagnosis not present

## 2021-09-03 DIAGNOSIS — D692 Other nonthrombocytopenic purpura: Secondary | ICD-10-CM | POA: Diagnosis not present

## 2021-09-11 DIAGNOSIS — M1711 Unilateral primary osteoarthritis, right knee: Secondary | ICD-10-CM | POA: Diagnosis not present

## 2021-09-12 DIAGNOSIS — L578 Other skin changes due to chronic exposure to nonionizing radiation: Secondary | ICD-10-CM | POA: Diagnosis not present

## 2021-09-12 DIAGNOSIS — L821 Other seborrheic keratosis: Secondary | ICD-10-CM | POA: Diagnosis not present

## 2021-09-12 DIAGNOSIS — L570 Actinic keratosis: Secondary | ICD-10-CM | POA: Diagnosis not present

## 2021-09-12 DIAGNOSIS — Z859 Personal history of malignant neoplasm, unspecified: Secondary | ICD-10-CM | POA: Diagnosis not present

## 2021-09-12 DIAGNOSIS — X32XXXS Exposure to sunlight, sequela: Secondary | ICD-10-CM | POA: Diagnosis not present

## 2021-09-12 DIAGNOSIS — L814 Other melanin hyperpigmentation: Secondary | ICD-10-CM | POA: Diagnosis not present

## 2021-09-12 DIAGNOSIS — D1801 Hemangioma of skin and subcutaneous tissue: Secondary | ICD-10-CM | POA: Diagnosis not present

## 2021-09-12 DIAGNOSIS — D692 Other nonthrombocytopenic purpura: Secondary | ICD-10-CM | POA: Diagnosis not present

## 2021-09-12 DIAGNOSIS — Z85828 Personal history of other malignant neoplasm of skin: Secondary | ICD-10-CM | POA: Diagnosis not present

## 2021-10-15 DIAGNOSIS — H35063 Retinal vasculitis, bilateral: Secondary | ICD-10-CM | POA: Diagnosis not present

## 2021-10-15 DIAGNOSIS — H33012 Retinal detachment with single break, left eye: Secondary | ICD-10-CM | POA: Diagnosis not present

## 2021-10-15 DIAGNOSIS — H30031 Focal chorioretinal inflammation, peripheral, right eye: Secondary | ICD-10-CM | POA: Diagnosis not present

## 2021-10-15 DIAGNOSIS — Z8669 Personal history of other diseases of the nervous system and sense organs: Secondary | ICD-10-CM | POA: Diagnosis not present

## 2021-10-15 DIAGNOSIS — H04123 Dry eye syndrome of bilateral lacrimal glands: Secondary | ICD-10-CM | POA: Diagnosis not present

## 2021-10-15 DIAGNOSIS — H35351 Cystoid macular degeneration, right eye: Secondary | ICD-10-CM | POA: Diagnosis not present

## 2021-10-15 DIAGNOSIS — Z79899 Other long term (current) drug therapy: Secondary | ICD-10-CM | POA: Diagnosis not present

## 2021-10-15 DIAGNOSIS — Z961 Presence of intraocular lens: Secondary | ICD-10-CM | POA: Diagnosis not present

## 2021-10-15 DIAGNOSIS — H35363 Drusen (degenerative) of macula, bilateral: Secondary | ICD-10-CM | POA: Diagnosis not present

## 2021-11-25 DIAGNOSIS — M7552 Bursitis of left shoulder: Secondary | ICD-10-CM | POA: Diagnosis not present

## 2021-12-07 DIAGNOSIS — I10 Essential (primary) hypertension: Secondary | ICD-10-CM | POA: Diagnosis not present

## 2021-12-07 DIAGNOSIS — R4020 Unspecified coma: Secondary | ICD-10-CM | POA: Diagnosis not present

## 2021-12-07 DIAGNOSIS — S0003XA Contusion of scalp, initial encounter: Secondary | ICD-10-CM | POA: Diagnosis not present

## 2021-12-07 DIAGNOSIS — S199XXA Unspecified injury of neck, initial encounter: Secondary | ICD-10-CM | POA: Diagnosis not present

## 2021-12-07 DIAGNOSIS — R55 Syncope and collapse: Secondary | ICD-10-CM | POA: Diagnosis not present

## 2021-12-07 DIAGNOSIS — M47812 Spondylosis without myelopathy or radiculopathy, cervical region: Secondary | ICD-10-CM | POA: Diagnosis not present

## 2021-12-07 DIAGNOSIS — S0990XA Unspecified injury of head, initial encounter: Secondary | ICD-10-CM | POA: Diagnosis not present

## 2021-12-07 DIAGNOSIS — R58 Hemorrhage, not elsewhere classified: Secondary | ICD-10-CM | POA: Diagnosis not present

## 2021-12-31 DIAGNOSIS — H35363 Drusen (degenerative) of macula, bilateral: Secondary | ICD-10-CM | POA: Diagnosis not present

## 2021-12-31 DIAGNOSIS — Z8669 Personal history of other diseases of the nervous system and sense organs: Secondary | ICD-10-CM | POA: Diagnosis not present

## 2021-12-31 DIAGNOSIS — H33012 Retinal detachment with single break, left eye: Secondary | ICD-10-CM | POA: Diagnosis not present

## 2021-12-31 DIAGNOSIS — Z961 Presence of intraocular lens: Secondary | ICD-10-CM | POA: Diagnosis not present

## 2021-12-31 DIAGNOSIS — H30033 Focal chorioretinal inflammation, peripheral, bilateral: Secondary | ICD-10-CM | POA: Diagnosis not present

## 2021-12-31 DIAGNOSIS — H35351 Cystoid macular degeneration, right eye: Secondary | ICD-10-CM | POA: Diagnosis not present

## 2021-12-31 DIAGNOSIS — Z79899 Other long term (current) drug therapy: Secondary | ICD-10-CM | POA: Diagnosis not present

## 2021-12-31 DIAGNOSIS — H04123 Dry eye syndrome of bilateral lacrimal glands: Secondary | ICD-10-CM | POA: Diagnosis not present

## 2021-12-31 DIAGNOSIS — H35063 Retinal vasculitis, bilateral: Secondary | ICD-10-CM | POA: Diagnosis not present

## 2022-01-06 ENCOUNTER — Other Ambulatory Visit: Payer: Self-pay

## 2022-01-06 ENCOUNTER — Ambulatory Visit: Payer: PPO | Admitting: Cardiology

## 2022-01-06 ENCOUNTER — Encounter: Payer: Self-pay | Admitting: Cardiology

## 2022-01-06 VITALS — BP 146/90 | HR 62 | Ht 71.0 in | Wt 180.4 lb

## 2022-01-06 DIAGNOSIS — I471 Supraventricular tachycardia: Secondary | ICD-10-CM | POA: Diagnosis not present

## 2022-01-06 NOTE — Patient Instructions (Signed)
Medication Instructions:  ?NO CHANGES ?*If you need a refill on your cardiac medications before your next appointment, please call your pharmacy* ? ? ?Lab Work: ?NONE ?If you have labs (blood work) drawn today and your tests are completely normal, you will receive your results only by: ?MyChart Message (if you have MyChart) OR ?A paper copy in the mail ?If you have any lab test that is abnormal or we need to change your treatment, we will call you to review the results. ? ? ?Testing/Procedures: ?NONE ? ? ?Follow-Up: ?At [facility], you and your health needs are our priority.  As part of our continuing mission to provide you with exceptional heart care, we have created designated Provider Care Teams.  These Care Teams include your primary Cardiologist (physician) and Advanced Practice Providers (APPs -  Physician Assistants and Nurse Practitioners) who all work together to provide you with the care you need, when you need it. ? ?We recommend signing up for the patient portal called "MyChart".  Sign up information is provided on this After Visit Summary.  MyChart is used to connect with patients for Virtual Visits (Telemedicine).  Patients are able to view lab/test results, encounter notes, upcoming appointments, etc.  Non-urgent messages can be sent to your provider as well.   ?To learn more about what you can do with MyChart, go to [email or web address].   ? ?Your next appointment:   ?6 month(s) ? ?The format for your next appointment:   ?In Person ? ?Provider:   ?DR Curt Bears   ? ? ?Other Instructions ?NONE  ?

## 2022-01-06 NOTE — Progress Notes (Signed)
? ?Electrophysiology Office Note ? ? ?Date:  01/06/2022  ? ?ID:  Lawrence Gomez, DOB Mar 30, 1946, MRN [id number] ? ?PCP:  Lawrence Collie, MD  ?Cardiologist: Lawrence Gomez ?Primary Electrophysiologist:  Lawrence Lave Meredith Leeds, MD   ? ?Chief Complaint: SVT ?  ?History of Present Illness: ?Lawrence Gomez is a 76 y.o. male who is being seen today for the evaluation of SVT at the request of Lawrence Collie, MD. Presenting today for electrophysiology evaluation. ? ?  He does have a resting bradycardia.History significant for obstructive sleep apnea and hyperlipidemia.  He presented to the hospital h 09/28/2020 with wide-complex tachycardia.  He was working with physical therapy at home and had a heart rate of 200 bpm.  He was given IV lidocaine without improvement.  6 mg of adenosine converted to sinus rhythm.  He wore a cardiac monitor that showed episodes of SVT as well. ? ?Today, denies symptoms of chest pain, shortness of breath, orthopnea, PND, lower extremity edema, claudication, dizziness, presyncope, bleeding, or neurologic sequela. The patient is tolerating medications without difficulties.  Since being seen he has continued to have episodes of SVT.  Episodes occur on a weekly basis.  He has had to take an extra dose of metoprolol at times.  He has found no triggers for his episodes of SVT.  He also had an episode of syncope.  He was standing at the time.  He felt well prior to the episode.  He went to the emergency room and was told that he was dehydrated. ? ? ?Past Medical History:  ?Diagnosis Date  ? Aortic atherosclerosis ([place])   ? History of cardiac catheterization   ? Dr. Bettina Gomez, ?2004/2005, no intervention  ? Hyperlipidemia   ? OSA on CPAP   ? ?Past Surgical History:  ?Procedure Laterality Date  ? REPLACEMENT TOTAL KNEE    ? ? ? ?Current Outpatient Medications  ?Medication Sig Dispense Refill  ? acetaminophen (TYLENOL) 500 MG tablet Take 1,000 mg by mouth 2 (two) times daily as needed for mild pain.    ? citalopram (CELEXA)  20 MG tablet Take 20 mg by mouth daily.    ? metoprolol tartrate (LOPRESSOR) 25 MG tablet Take 1 tablet (25 mg total) by mouth 2 (two) times daily. 180 tablet 3  ? pantoprazole (PROTONIX) 40 MG tablet Take 40 mg by mouth daily.    ? pravastatin (PRAVACHOL) 20 MG tablet Take 20 mg by mouth every other day.    ? ?No current facility-administered medications for this visit.  ? ? ?Allergies:   Penicillins  ? ?Social History:  The patient  reports that he has quit smoking. He has never used smokeless tobacco.  ? ?Family History:  The patient's family history includes Hypertension in his father.  ? ?ROS:  Please see the history of present illness.   Otherwise, review of systems is positive for none.   All other systems are reviewed and negative.  ? ?PHYSICAL EXAM: ?VS:  BP (!) 146/90   Pulse 62   Ht '5\' 11"'$  (1.803 m)   Wt 180 lb 6.4 oz (81.8 kg)   SpO2 98%   BMI 25.16 kg/m?  , BMI Body mass index is 25.16 kg/m?. ?GEN: Well nourished, well developed, in no acute distress  ?HEENT: normal  ?Neck: no JVD, carotid bruits, or masses ?Cardiac: RRR; no murmurs, rubs, or gallops,no edema  ?Respiratory:  clear to auscultation bilaterally, normal work of breathing ?GI: soft, nontender, nondistended, + BS ?MS: no deformity or atrophy  ?Skin:  warm and dry ?Neuro:  Strength and sensation are intact ?Psych: euthymic mood, full affect ? ?EKG:  EKG is not ordered today. ?Personal review of the ekg ordered 07/08/21 shows sinus rhythm, rate 52, first-degree AV block ? ?Recent Labs: ?No results found for requested labs within last 8760 hours.  ? ? ?Lipid Panel  ?   ?Component Value Date/Time  ? CHOL 150 09/29/2020 0755  ? TRIG 72 09/29/2020 0755  ? HDL 70 09/29/2020 0755  ? CHOLHDL 2.1 09/29/2020 0755  ? VLDL 14 09/29/2020 0755  ? [place] 66 09/29/2020 0755  ? ? ? ?Wt Readings from Last 3 Encounters:  ?01/06/22 180 lb 6.4 oz (81.8 kg)  ?07/08/21 182 lb 3.2 oz (82.6 kg)  ?03/04/21 180 lb (81.6 kg)  ?  ? ? ?Other studies  Reviewed: ?Additional studies/ records that were reviewed today include: TTE 09/29/20  ?Review of the above records today demonstrates:  ? 1. Left ventricular ejection fraction, by estimation, is 50 to 55%. The  ?left ventricle has low normal function. The left ventricle demonstrates  ?global hypokinesis. Left ventricular diastolic parameters are consistent  ?with Grade I diastolic dysfunction  ?(impaired relaxation).  ? 2. Right ventricular systolic function is normal. The right ventricular  ?size is normal. Tricuspid regurgitation signal is inadequate for assessing  ?PA pressure.  ? 3. The mitral valve is normal in structure. No evidence of mitral valve  ?regurgitation.  ? 4. The aortic valve was not well visualized. Aortic valve regurgitation  ?is mild to moderate. No aortic stenosis is present.  ? 5. Aortic dilatation noted. There is mild dilatation of the aortic root,  ?measuring 40 mm. There is mild dilatation of the ascending aorta,  ?measuring 39 mm.  ? 6. The inferior vena cava is normal in size with greater than 50%  ?respiratory variability, suggesting right atrial pressure of 3 mmHg.  ? ?Cardiac monitor 11/04/2020 personally reviewed ?85 episodes of SVT, longest lasting 3 minutes and 17 seconds with average rate 181 bpm ?Occasional PVCs (1.1%). ? ? ?ASSESSMENT AND PLAN: ? ?1.  SVT: Presented to the hospital with a wide-complex tachycardia terminated with adenosine.  He wore a 2-week Zio patch that showed episodes of SVT.  Currently on metoprolol 25 mg twice daily.  He is continued to have episodes of SVT.  He potentially be interested in ablation.  We Lawrence Gomez give him information to discuss with his insurance company to check on the cost. ? ?2.  Syncope: Patient had an episode of syncope while standing.  It is unclear as to the cause of his episode of syncope.  We discussed Lawrence Gomez monitor implant.  He Lawrence Gomez consider this further and let us know. ? ? ?Current medicines are reviewed at length with the patient  today.   ?The patient does not have concerns regarding his medicines.  The following changes were made today: None ? ?Labs/ tests ordered today include:  ?No orders of the defined types were placed in this encounter. ? ? ? ? ?Disposition:   FU with Filomeno Cromley 6 months ? ?Signed, ?Ernestine Rohman Meredith Leeds, MD  ?01/06/2022 10:36 AM    ? ?[facility] ?[address] ?[address] ?[place] [address] ?[phone] (office) ?[phone] (fax) ? ?

## 2022-03-14 DIAGNOSIS — M19012 Primary osteoarthritis, left shoulder: Secondary | ICD-10-CM | POA: Diagnosis not present

## 2022-03-14 DIAGNOSIS — M25512 Pain in left shoulder: Secondary | ICD-10-CM | POA: Diagnosis not present

## 2022-03-18 DIAGNOSIS — M7552 Bursitis of left shoulder: Secondary | ICD-10-CM | POA: Diagnosis not present

## 2022-03-20 DIAGNOSIS — Z136 Encounter for screening for cardiovascular disorders: Secondary | ICD-10-CM | POA: Diagnosis not present

## 2022-03-20 DIAGNOSIS — Z6825 Body mass index (BMI) 25.0-25.9, adult: Secondary | ICD-10-CM | POA: Diagnosis not present

## 2022-03-20 DIAGNOSIS — Z0000 Encounter for general adult medical examination without abnormal findings: Secondary | ICD-10-CM | POA: Diagnosis not present

## 2022-03-20 DIAGNOSIS — E785 Hyperlipidemia, unspecified: Secondary | ICD-10-CM | POA: Diagnosis not present

## 2022-03-20 DIAGNOSIS — Z789 Other specified health status: Secondary | ICD-10-CM | POA: Diagnosis not present

## 2022-03-20 DIAGNOSIS — Z1331 Encounter for screening for depression: Secondary | ICD-10-CM | POA: Diagnosis not present

## 2022-03-20 DIAGNOSIS — Z1339 Encounter for screening examination for other mental health and behavioral disorders: Secondary | ICD-10-CM | POA: Diagnosis not present

## 2022-03-20 DIAGNOSIS — D692 Other nonthrombocytopenic purpura: Secondary | ICD-10-CM | POA: Diagnosis not present

## 2022-03-20 DIAGNOSIS — F419 Anxiety disorder, unspecified: Secondary | ICD-10-CM | POA: Diagnosis not present

## 2022-03-20 DIAGNOSIS — I700 Atherosclerosis of aorta: Secondary | ICD-10-CM | POA: Diagnosis not present

## 2022-03-20 DIAGNOSIS — Z1389 Encounter for screening for other disorder: Secondary | ICD-10-CM | POA: Diagnosis not present

## 2022-03-20 DIAGNOSIS — Z139 Encounter for screening, unspecified: Secondary | ICD-10-CM | POA: Diagnosis not present

## 2022-04-03 DIAGNOSIS — L570 Actinic keratosis: Secondary | ICD-10-CM | POA: Diagnosis not present

## 2022-05-02 DIAGNOSIS — G4733 Obstructive sleep apnea (adult) (pediatric): Secondary | ICD-10-CM | POA: Diagnosis not present

## 2022-05-26 ENCOUNTER — Other Ambulatory Visit: Payer: Self-pay | Admitting: Cardiology

## 2022-05-26 DIAGNOSIS — G4733 Obstructive sleep apnea (adult) (pediatric): Secondary | ICD-10-CM | POA: Diagnosis not present

## 2022-06-10 DIAGNOSIS — M25552 Pain in left hip: Secondary | ICD-10-CM | POA: Diagnosis not present

## 2022-06-11 DIAGNOSIS — R2689 Other abnormalities of gait and mobility: Secondary | ICD-10-CM | POA: Diagnosis not present

## 2022-06-11 DIAGNOSIS — M25552 Pain in left hip: Secondary | ICD-10-CM | POA: Diagnosis not present

## 2022-06-11 DIAGNOSIS — M62552 Muscle wasting and atrophy, not elsewhere classified, left thigh: Secondary | ICD-10-CM | POA: Diagnosis not present

## 2022-06-13 DIAGNOSIS — M62552 Muscle wasting and atrophy, not elsewhere classified, left thigh: Secondary | ICD-10-CM | POA: Diagnosis not present

## 2022-06-13 DIAGNOSIS — M25552 Pain in left hip: Secondary | ICD-10-CM | POA: Diagnosis not present

## 2022-06-13 DIAGNOSIS — R2689 Other abnormalities of gait and mobility: Secondary | ICD-10-CM | POA: Diagnosis not present

## 2022-06-17 DIAGNOSIS — M25552 Pain in left hip: Secondary | ICD-10-CM | POA: Diagnosis not present

## 2022-06-17 DIAGNOSIS — M62552 Muscle wasting and atrophy, not elsewhere classified, left thigh: Secondary | ICD-10-CM | POA: Diagnosis not present

## 2022-06-17 DIAGNOSIS — R2689 Other abnormalities of gait and mobility: Secondary | ICD-10-CM | POA: Diagnosis not present

## 2022-06-20 DIAGNOSIS — M62552 Muscle wasting and atrophy, not elsewhere classified, left thigh: Secondary | ICD-10-CM | POA: Diagnosis not present

## 2022-06-20 DIAGNOSIS — M25552 Pain in left hip: Secondary | ICD-10-CM | POA: Diagnosis not present

## 2022-06-20 DIAGNOSIS — R2689 Other abnormalities of gait and mobility: Secondary | ICD-10-CM | POA: Diagnosis not present

## 2022-06-25 DIAGNOSIS — M62552 Muscle wasting and atrophy, not elsewhere classified, left thigh: Secondary | ICD-10-CM | POA: Diagnosis not present

## 2022-06-25 DIAGNOSIS — R2689 Other abnormalities of gait and mobility: Secondary | ICD-10-CM | POA: Diagnosis not present

## 2022-06-25 DIAGNOSIS — M25552 Pain in left hip: Secondary | ICD-10-CM | POA: Diagnosis not present

## 2022-06-27 DIAGNOSIS — R2689 Other abnormalities of gait and mobility: Secondary | ICD-10-CM | POA: Diagnosis not present

## 2022-06-27 DIAGNOSIS — M62552 Muscle wasting and atrophy, not elsewhere classified, left thigh: Secondary | ICD-10-CM | POA: Diagnosis not present

## 2022-06-27 DIAGNOSIS — M25552 Pain in left hip: Secondary | ICD-10-CM | POA: Diagnosis not present

## 2022-07-02 DIAGNOSIS — M25552 Pain in left hip: Secondary | ICD-10-CM | POA: Diagnosis not present

## 2022-07-02 DIAGNOSIS — M62552 Muscle wasting and atrophy, not elsewhere classified, left thigh: Secondary | ICD-10-CM | POA: Diagnosis not present

## 2022-07-02 DIAGNOSIS — R2689 Other abnormalities of gait and mobility: Secondary | ICD-10-CM | POA: Diagnosis not present

## 2022-07-09 DIAGNOSIS — R2689 Other abnormalities of gait and mobility: Secondary | ICD-10-CM | POA: Diagnosis not present

## 2022-07-09 DIAGNOSIS — M62552 Muscle wasting and atrophy, not elsewhere classified, left thigh: Secondary | ICD-10-CM | POA: Diagnosis not present

## 2022-07-09 DIAGNOSIS — M25552 Pain in left hip: Secondary | ICD-10-CM | POA: Diagnosis not present

## 2022-07-14 DIAGNOSIS — R2689 Other abnormalities of gait and mobility: Secondary | ICD-10-CM | POA: Diagnosis not present

## 2022-07-14 DIAGNOSIS — M25552 Pain in left hip: Secondary | ICD-10-CM | POA: Diagnosis not present

## 2022-07-14 DIAGNOSIS — M62552 Muscle wasting and atrophy, not elsewhere classified, left thigh: Secondary | ICD-10-CM | POA: Diagnosis not present

## 2022-07-21 ENCOUNTER — Ambulatory Visit: Payer: PPO | Admitting: Cardiology

## 2022-07-25 DIAGNOSIS — M62552 Muscle wasting and atrophy, not elsewhere classified, left thigh: Secondary | ICD-10-CM | POA: Diagnosis not present

## 2022-07-25 DIAGNOSIS — M25552 Pain in left hip: Secondary | ICD-10-CM | POA: Diagnosis not present

## 2022-07-25 DIAGNOSIS — R2689 Other abnormalities of gait and mobility: Secondary | ICD-10-CM | POA: Diagnosis not present

## 2022-08-01 DIAGNOSIS — M25552 Pain in left hip: Secondary | ICD-10-CM | POA: Diagnosis not present

## 2022-08-01 DIAGNOSIS — M62552 Muscle wasting and atrophy, not elsewhere classified, left thigh: Secondary | ICD-10-CM | POA: Diagnosis not present

## 2022-08-01 DIAGNOSIS — R2689 Other abnormalities of gait and mobility: Secondary | ICD-10-CM | POA: Diagnosis not present

## 2022-08-06 DIAGNOSIS — M62552 Muscle wasting and atrophy, not elsewhere classified, left thigh: Secondary | ICD-10-CM | POA: Diagnosis not present

## 2022-08-06 DIAGNOSIS — R2689 Other abnormalities of gait and mobility: Secondary | ICD-10-CM | POA: Diagnosis not present

## 2022-08-06 DIAGNOSIS — M25552 Pain in left hip: Secondary | ICD-10-CM | POA: Diagnosis not present

## 2022-08-15 DIAGNOSIS — M25552 Pain in left hip: Secondary | ICD-10-CM | POA: Diagnosis not present

## 2022-08-15 DIAGNOSIS — M62552 Muscle wasting and atrophy, not elsewhere classified, left thigh: Secondary | ICD-10-CM | POA: Diagnosis not present

## 2022-08-15 DIAGNOSIS — R2689 Other abnormalities of gait and mobility: Secondary | ICD-10-CM | POA: Diagnosis not present

## 2022-08-19 DIAGNOSIS — H35363 Drusen (degenerative) of macula, bilateral: Secondary | ICD-10-CM | POA: Diagnosis not present

## 2022-08-19 DIAGNOSIS — H35351 Cystoid macular degeneration, right eye: Secondary | ICD-10-CM | POA: Diagnosis not present

## 2022-08-19 DIAGNOSIS — Z961 Presence of intraocular lens: Secondary | ICD-10-CM | POA: Diagnosis not present

## 2022-08-19 DIAGNOSIS — H35063 Retinal vasculitis, bilateral: Secondary | ICD-10-CM | POA: Diagnosis not present

## 2022-08-19 DIAGNOSIS — H30033 Focal chorioretinal inflammation, peripheral, bilateral: Secondary | ICD-10-CM | POA: Diagnosis not present

## 2022-08-19 DIAGNOSIS — H04123 Dry eye syndrome of bilateral lacrimal glands: Secondary | ICD-10-CM | POA: Diagnosis not present

## 2022-08-19 DIAGNOSIS — Z79899 Other long term (current) drug therapy: Secondary | ICD-10-CM | POA: Diagnosis not present

## 2022-08-19 DIAGNOSIS — H33012 Retinal detachment with single break, left eye: Secondary | ICD-10-CM | POA: Diagnosis not present

## 2022-08-19 DIAGNOSIS — Z8669 Personal history of other diseases of the nervous system and sense organs: Secondary | ICD-10-CM | POA: Diagnosis not present

## 2022-08-22 DIAGNOSIS — M62552 Muscle wasting and atrophy, not elsewhere classified, left thigh: Secondary | ICD-10-CM | POA: Diagnosis not present

## 2022-08-22 DIAGNOSIS — M25552 Pain in left hip: Secondary | ICD-10-CM | POA: Diagnosis not present

## 2022-08-22 DIAGNOSIS — R2689 Other abnormalities of gait and mobility: Secondary | ICD-10-CM | POA: Diagnosis not present

## 2022-09-01 ENCOUNTER — Ambulatory Visit: Payer: PPO | Attending: Cardiology | Admitting: Cardiology

## 2022-09-01 ENCOUNTER — Encounter: Payer: Self-pay | Admitting: Cardiology

## 2022-09-01 VITALS — BP 142/88 | HR 63 | Ht 71.0 in | Wt 187.0 lb

## 2022-09-01 DIAGNOSIS — I471 Supraventricular tachycardia, unspecified: Secondary | ICD-10-CM | POA: Diagnosis not present

## 2022-09-01 NOTE — Progress Notes (Signed)
Electrophysiology Office Note   Date:  01/06/2022   ID:  Lawrence Gomez, DOB 01-Mar-1946, MRN [id number]  PCP:  Marco Collie, MD  Cardiologist: Gardiner Rhyme Primary Electrophysiologist:  Cayman Brogden Meredith Leeds, MD    Chief Complaint: SVT   History of Present Illness: Lawrence Gomez is a 76 y.o. male who is being seen today for the evaluation of SVT at the request of Marco Collie, MD. Presenting today for electrophysiology evaluation.  He has a history significant for resting bradycardia.  He also has obstructive sleep apnea and hyperlipidemia.  He presented to the hospital 09/28/2020 with wide-complex tachycardia.  He was working with physical therapy at the time and noted that his heart rate was 200 bpm.  He was given IV lidocaine without improvement.  6 mg of adenosine converted him to sinus rhythm.  He separately wore a monitor that showed episodes of SVT.  Today, denies symptoms of palpitations, chest pain, shortness of breath, orthopnea, PND, lower extremity edema, claudication, dizziness, presyncope, syncope, bleeding, or neurologic sequela. The patient is tolerating medications without difficulties.  Today he is doing well.  He noticed that he was having more episodes of SVT in the morning.  He started taking his metoprolol tartrate both pills in the morning.  Since then, he has done much better with less episodes of SVT.   Past Medical History:  Diagnosis Date   Aortic atherosclerosis ([place])    History of cardiac catheterization    Dr. Bettina Gavia, ?2004/2005, no intervention   Hyperlipidemia    OSA on CPAP    Past Surgical History:  Procedure Laterality Date   REPLACEMENT TOTAL KNEE       Current Outpatient Medications  Medication Sig Dispense Refill   acetaminophen (TYLENOL) 500 MG tablet Take 1,000 mg by mouth 2 (two) times daily as needed for mild pain.     citalopram (CELEXA) 20 MG tablet Take 20 mg by mouth daily.     metoprolol tartrate (LOPRESSOR) 25 MG tablet Take 1 tablet (25  mg total) by mouth 2 (two) times daily. 180 tablet 3   pantoprazole (PROTONIX) 40 MG tablet Take 40 mg by mouth daily.     pravastatin (PRAVACHOL) 20 MG tablet Take 20 mg by mouth every other day.     No current facility-administered medications for this visit.    Allergies:   Penicillins   Social History:  The patient  reports that he has quit smoking. He has never used smokeless tobacco.   Family History:  The patient's family history includes Hypertension in his father.   ROS:  Please see the history of present illness.   Otherwise, review of systems is positive for none.   All other systems are reviewed and negative.   PHYSICAL EXAM: VS:  There were no vitals taken for this visit. , BMI There is no height or weight on file to calculate BMI. GEN: Well nourished, well developed, in no acute distress  HEENT: normal  Neck: no JVD, carotid bruits, or masses Cardiac: RRR; no murmurs, rubs, or gallops,no edema  Respiratory:  clear to auscultation bilaterally, normal work of breathing GI: soft, nontender, nondistended, + BS MS: no deformity or atrophy  Skin: warm and dry Neuro:  Strength and sensation are intact Psych: euthymic mood, full affect  EKG:  EKG is ordered today. Personal review of the ekg ordered shows sinus rhythm, rate 66, first-degree AV block  Recent Labs: No results found for requested labs within last 8760 hours.  Lipid Panel     Component Value Date/Time   CHOL 150 09/29/2020 0755   TRIG 72 09/29/2020 0755   HDL 70 09/29/2020 0755   CHOLHDL 2.1 09/29/2020 0755   VLDL 14 09/29/2020 0755   LDLCALC 66 09/29/2020 0755     Wt Readings from Last 3 Encounters:  01/06/22 180 lb 6.4 oz (81.8 kg)  07/08/21 182 lb 3.2 oz (82.6 kg)  03/04/21 180 lb (81.6 kg)      Other studies Reviewed: Additional studies/ records that were reviewed today include: TTE 09/29/20  Review of the above records today demonstrates:   1. Left ventricular ejection fraction, by  estimation, is 50 to 55%. The  left ventricle has low normal function. The left ventricle demonstrates  global hypokinesis. Left ventricular diastolic parameters are consistent  with Grade I diastolic dysfunction  (impaired relaxation).   2. Right ventricular systolic function is normal. The right ventricular  size is normal. Tricuspid regurgitation signal is inadequate for assessing  PA pressure.   3. The mitral valve is normal in structure. No evidence of mitral valve  regurgitation.   4. The aortic valve was not well visualized. Aortic valve regurgitation  is mild to moderate. No aortic stenosis is present.   5. Aortic dilatation noted. There is mild dilatation of the aortic root,  measuring 40 mm. There is mild dilatation of the ascending aorta,  measuring 39 mm.   6. The inferior vena cava is normal in size with greater than 50%  respiratory variability, suggesting right atrial pressure of 3 mmHg.   Cardiac monitor 11/04/2020 personally reviewed 85 episodes of SVT, longest lasting 3 minutes and 17 seconds with average rate 181 bpm Occasional PVCs (1.1%).   ASSESSMENT AND PLAN:  1.  SVT: Presented to the hospital with a wide-complex tachycardia was terminated with adenosine.  Wore a 2-week Zio patch that showed episodes of SVT.  Currently on metoprolol 50 mg daily.  He is feeling well despite taking short acting metoprolol once a day.  He is having much fewer episodes of SVT.  We Macil Crady continue with current management.  2.  Syncope: An episode of syncope while standing.  Unclear as to the cause he was told he was dehydrated on presentation to the emergency room.  Had previously discussed ILR implant.  Point, he has been feeling well and would like to avoid this.  Current medicines are reviewed at length with the patient today.   The patient does not have concerns regarding his medicines.  The following changes were made today: None  Labs/ tests ordered today include:  No orders of  the defined types were placed in this encounter.     Disposition:   FU with Haeven Nickle 6 months  Signed, Kynnadi Dicenso Meredith Leeds, MD  01/06/2022 10:36 AM     [facility] [address] [place] [address] [phone] (office) [phone] (fax)

## 2022-09-01 NOTE — Patient Instructions (Signed)
Medication Instructions:  Your physician recommends that you continue on your current medications as directed. Please refer to the Current Medication list given to you today.  *If you need a refill on your cardiac medications before your next appointment, please call your pharmacy*   Lab Work: None ordered   Testing/Procedures: None ordered   Follow-Up: At [facility], you and your health needs are our priority.  As part of our continuing mission to provide you with exceptional heart care, we have created designated Provider Care Teams.  These Care Teams include your primary Cardiologist (physician) and Advanced Practice Providers (APPs -  Physician Assistants and Nurse Practitioners) who all work together to provide you with the care you need, when you need it.  We recommend signing up for the patient portal called "MyChart".  Sign up information is provided on this After Visit Summary.  MyChart is used to connect with patients for Virtual Visits (Telemedicine).  Patients are able to view lab/test results, encounter notes, upcoming appointments, etc.  Non-urgent messages can be sent to your provider as well.   To learn more about what you can do with MyChart, go to [email or web address].    Your next appointment:   6 month(s)  The format for your next appointment:   In Person  Provider:   Allegra Lai, MD    Thank you for choosing [facility]!!   Trinidad Curet, RN [phone]  Other Instructions    Important Information About Sugar

## 2022-09-16 DIAGNOSIS — D1801 Hemangioma of skin and subcutaneous tissue: Secondary | ICD-10-CM | POA: Diagnosis not present

## 2022-09-16 DIAGNOSIS — Z85828 Personal history of other malignant neoplasm of skin: Secondary | ICD-10-CM | POA: Diagnosis not present

## 2022-09-16 DIAGNOSIS — D692 Other nonthrombocytopenic purpura: Secondary | ICD-10-CM | POA: Diagnosis not present

## 2022-09-16 DIAGNOSIS — L821 Other seborrheic keratosis: Secondary | ICD-10-CM | POA: Diagnosis not present

## 2022-09-16 DIAGNOSIS — L578 Other skin changes due to chronic exposure to nonionizing radiation: Secondary | ICD-10-CM | POA: Diagnosis not present

## 2022-09-16 DIAGNOSIS — Z859 Personal history of malignant neoplasm, unspecified: Secondary | ICD-10-CM | POA: Diagnosis not present

## 2022-09-16 DIAGNOSIS — L570 Actinic keratosis: Secondary | ICD-10-CM | POA: Diagnosis not present

## 2023-02-23 ENCOUNTER — Other Ambulatory Visit: Payer: Self-pay | Admitting: Cardiology

## 2023-02-24 DIAGNOSIS — Z79899 Other long term (current) drug therapy: Secondary | ICD-10-CM | POA: Diagnosis not present

## 2023-02-24 DIAGNOSIS — H35063 Retinal vasculitis, bilateral: Secondary | ICD-10-CM | POA: Diagnosis not present

## 2023-02-24 DIAGNOSIS — H35351 Cystoid macular degeneration, right eye: Secondary | ICD-10-CM | POA: Diagnosis not present

## 2023-02-24 DIAGNOSIS — H04123 Dry eye syndrome of bilateral lacrimal glands: Secondary | ICD-10-CM | POA: Diagnosis not present

## 2023-02-24 DIAGNOSIS — H35363 Drusen (degenerative) of macula, bilateral: Secondary | ICD-10-CM | POA: Diagnosis not present

## 2023-02-24 DIAGNOSIS — Z961 Presence of intraocular lens: Secondary | ICD-10-CM | POA: Diagnosis not present

## 2023-02-24 DIAGNOSIS — H30033 Focal chorioretinal inflammation, peripheral, bilateral: Secondary | ICD-10-CM | POA: Diagnosis not present

## 2023-02-24 DIAGNOSIS — Z8669 Personal history of other diseases of the nervous system and sense organs: Secondary | ICD-10-CM | POA: Diagnosis not present

## 2023-03-17 DIAGNOSIS — Z85828 Personal history of other malignant neoplasm of skin: Secondary | ICD-10-CM | POA: Diagnosis not present

## 2023-03-17 DIAGNOSIS — Z859 Personal history of malignant neoplasm, unspecified: Secondary | ICD-10-CM | POA: Diagnosis not present

## 2023-03-17 DIAGNOSIS — D1801 Hemangioma of skin and subcutaneous tissue: Secondary | ICD-10-CM | POA: Diagnosis not present

## 2023-03-17 DIAGNOSIS — L814 Other melanin hyperpigmentation: Secondary | ICD-10-CM | POA: Diagnosis not present

## 2023-03-17 DIAGNOSIS — L821 Other seborrheic keratosis: Secondary | ICD-10-CM | POA: Diagnosis not present

## 2023-03-17 DIAGNOSIS — L570 Actinic keratosis: Secondary | ICD-10-CM | POA: Diagnosis not present

## 2023-03-17 DIAGNOSIS — L578 Other skin changes due to chronic exposure to nonionizing radiation: Secondary | ICD-10-CM | POA: Diagnosis not present

## 2023-06-01 ENCOUNTER — Ambulatory Visit: Payer: PPO | Attending: Cardiology | Admitting: Cardiology

## 2023-06-01 ENCOUNTER — Encounter: Payer: Self-pay | Admitting: Cardiology

## 2023-06-01 VITALS — BP 124/82 | HR 66 | Ht 70.0 in | Wt 191.0 lb

## 2023-06-01 DIAGNOSIS — I471 Supraventricular tachycardia, unspecified: Secondary | ICD-10-CM

## 2023-06-01 DIAGNOSIS — R000 Tachycardia, unspecified: Secondary | ICD-10-CM | POA: Diagnosis not present

## 2023-06-01 MED ORDER — METOPROLOL TARTRATE 25 MG PO TABS: 25.0000 mg | ORAL_TABLET | Freq: Two times a day (BID) | ORAL | 3 refills | Status: DC

## 2023-06-01 NOTE — Progress Notes (Signed)
  Electrophysiology Office Note:   Date:  06/01/2023  ID:  Lawrence Gomez, DOB [birth year], MRN [id number]  Primary Cardiologist: Little Ishikawa, MD Electrophysiologist: Regan Lemming, MD      History of Present Illness:   Lawrence Gomez is a 77 y.o. male with h/o SVT seen today for routine electrophysiology followup.  Since last being seen in our clinic the patient reports doing well.  He did have a day with some SVT in June, but since then has had no more episodes.  He is happy being on his metoprolol and does not want any further adjustments.  he denies chest pain, palpitations, dyspnea, PND, orthopnea, nausea, vomiting, dizziness, syncope, edema, weight gain, or early satiety.     He has a history significant for resting bradycardia. He also has obstructive sleep apnea and hyperlipidemia. He presented to the hospital 09/28/2020 with wide-complex tachycardia. He was working with physical therapy at the time and noted that his heart rate was 200 bpm. He was given IV lidocaine without improvement. 6 mg of adenosine converted him to sinus rhythm. He separately wore a monitor that showed episodes of SVT.      Review of systems complete and found to be negative unless listed in HPI.   EP Information / Studies Reviewed:    EKG is ordered today. Personal review as below.  EKG Interpretation Date/Time:  Monday June 01 2023 09:55:41 EDT Ventricular Rate:  66 PR Interval:  268 QRS Duration:  112 QT Interval:  458 QTC Calculation: 480 R Axis:   -28  Text Interpretation: Sinus rhythm with 1st degree A-V block Left ventricular hypertrophy Prolonged QT Abnormal ECG When compared with ECG of 28-Sep-2020 12:11, Rate slower Confirmed by Latora Quarry ([id number]) on 06/01/2023 10:17:27 AM     Risk Assessment/Calculations:              Physical Exam:   VS:  BP 124/82   Pulse 66   Ht 5\' 10"  (1.778 m)   Wt 191 lb (86.6 kg)   SpO2 95%   BMI 27.41 kg/m    Wt Readings from Last 3  Encounters:  06/01/23 191 lb (86.6 kg)  09/01/22 187 lb (84.8 kg)  01/06/22 180 lb 6.4 oz (81.8 kg)     GEN: Well nourished, well developed in no acute distress NECK: No JVD; No carotid bruits CARDIAC: Regular rate and rhythm, no murmurs, rubs, gallops RESPIRATORY:  Clear to auscultation without rales, wheezing or rhonchi  ABDOMEN: Soft, non-tender, non-distended EXTREMITIES:  No edema; No deformity   ASSESSMENT AND PLAN:    1.  SVT: Large Zio patch that showed SVT.  Did have wide-complex tachycardia that terminated with adenosine.  Currently on metoprolol.  Under good control.  Minimal episodes.  No changes.  2.  Syncope: He is had episode of syncope while standing.  Was dehydrated on presentation the emergency room.  No further episodes.  Follow up with Dr. Elberta Fortis in 12 months  Signed, Saprina Chuong Jorja Loa, MD

## 2023-06-01 NOTE — Patient Instructions (Signed)

## 2023-06-04 DIAGNOSIS — G4733 Obstructive sleep apnea (adult) (pediatric): Secondary | ICD-10-CM | POA: Diagnosis not present

## 2023-06-16 DIAGNOSIS — G4733 Obstructive sleep apnea (adult) (pediatric): Secondary | ICD-10-CM | POA: Diagnosis not present

## 2023-07-10 DIAGNOSIS — G4733 Obstructive sleep apnea (adult) (pediatric): Secondary | ICD-10-CM | POA: Diagnosis not present

## 2023-07-15 DIAGNOSIS — Z79899 Other long term (current) drug therapy: Secondary | ICD-10-CM | POA: Diagnosis not present

## 2023-07-15 DIAGNOSIS — H35351 Cystoid macular degeneration, right eye: Secondary | ICD-10-CM | POA: Diagnosis not present

## 2023-07-15 DIAGNOSIS — H33012 Retinal detachment with single break, left eye: Secondary | ICD-10-CM | POA: Diagnosis not present

## 2023-07-15 DIAGNOSIS — H35363 Drusen (degenerative) of macula, bilateral: Secondary | ICD-10-CM | POA: Diagnosis not present

## 2023-07-15 DIAGNOSIS — H30033 Focal chorioretinal inflammation, peripheral, bilateral: Secondary | ICD-10-CM | POA: Diagnosis not present

## 2023-07-15 DIAGNOSIS — Z8669 Personal history of other diseases of the nervous system and sense organs: Secondary | ICD-10-CM | POA: Diagnosis not present

## 2023-07-15 DIAGNOSIS — H04123 Dry eye syndrome of bilateral lacrimal glands: Secondary | ICD-10-CM | POA: Diagnosis not present

## 2023-07-15 DIAGNOSIS — Z961 Presence of intraocular lens: Secondary | ICD-10-CM | POA: Diagnosis not present

## 2023-07-17 DIAGNOSIS — G4733 Obstructive sleep apnea (adult) (pediatric): Secondary | ICD-10-CM | POA: Diagnosis not present

## 2023-07-23 DIAGNOSIS — G4733 Obstructive sleep apnea (adult) (pediatric): Secondary | ICD-10-CM | POA: Diagnosis not present

## 2023-08-16 DIAGNOSIS — G4733 Obstructive sleep apnea (adult) (pediatric): Secondary | ICD-10-CM | POA: Diagnosis not present

## 2023-08-19 DIAGNOSIS — Z139 Encounter for screening, unspecified: Secondary | ICD-10-CM | POA: Diagnosis not present

## 2023-08-19 DIAGNOSIS — Z1389 Encounter for screening for other disorder: Secondary | ICD-10-CM | POA: Diagnosis not present

## 2023-08-19 DIAGNOSIS — Z1331 Encounter for screening for depression: Secondary | ICD-10-CM | POA: Diagnosis not present

## 2023-08-19 DIAGNOSIS — K219 Gastro-esophageal reflux disease without esophagitis: Secondary | ICD-10-CM | POA: Diagnosis not present

## 2023-08-19 DIAGNOSIS — Z6826 Body mass index (BMI) 26.0-26.9, adult: Secondary | ICD-10-CM | POA: Diagnosis not present

## 2023-08-19 DIAGNOSIS — F419 Anxiety disorder, unspecified: Secondary | ICD-10-CM | POA: Diagnosis not present

## 2023-08-19 DIAGNOSIS — Z1339 Encounter for screening examination for other mental health and behavioral disorders: Secondary | ICD-10-CM | POA: Diagnosis not present

## 2023-08-19 DIAGNOSIS — Z0000 Encounter for general adult medical examination without abnormal findings: Secondary | ICD-10-CM | POA: Diagnosis not present

## 2023-08-19 DIAGNOSIS — E785 Hyperlipidemia, unspecified: Secondary | ICD-10-CM | POA: Diagnosis not present

## 2023-08-19 DIAGNOSIS — R55 Syncope and collapse: Secondary | ICD-10-CM | POA: Diagnosis not present

## 2023-08-19 DIAGNOSIS — Z136 Encounter for screening for cardiovascular disorders: Secondary | ICD-10-CM | POA: Diagnosis not present

## 2023-08-25 DIAGNOSIS — H04123 Dry eye syndrome of bilateral lacrimal glands: Secondary | ICD-10-CM | POA: Diagnosis not present

## 2023-08-25 DIAGNOSIS — Z961 Presence of intraocular lens: Secondary | ICD-10-CM | POA: Diagnosis not present

## 2023-08-25 DIAGNOSIS — H33012 Retinal detachment with single break, left eye: Secondary | ICD-10-CM | POA: Diagnosis not present

## 2023-08-25 DIAGNOSIS — H30033 Focal chorioretinal inflammation, peripheral, bilateral: Secondary | ICD-10-CM | POA: Diagnosis not present

## 2023-08-25 DIAGNOSIS — H35351 Cystoid macular degeneration, right eye: Secondary | ICD-10-CM | POA: Diagnosis not present

## 2023-08-25 DIAGNOSIS — Z8669 Personal history of other diseases of the nervous system and sense organs: Secondary | ICD-10-CM | POA: Diagnosis not present

## 2023-08-25 DIAGNOSIS — Z79899 Other long term (current) drug therapy: Secondary | ICD-10-CM | POA: Diagnosis not present

## 2023-08-25 DIAGNOSIS — H35063 Retinal vasculitis, bilateral: Secondary | ICD-10-CM | POA: Diagnosis not present

## 2023-08-25 DIAGNOSIS — H35363 Drusen (degenerative) of macula, bilateral: Secondary | ICD-10-CM | POA: Diagnosis not present

## 2023-09-16 DIAGNOSIS — G4733 Obstructive sleep apnea (adult) (pediatric): Secondary | ICD-10-CM | POA: Diagnosis not present

## 2023-10-13 DIAGNOSIS — L578 Other skin changes due to chronic exposure to nonionizing radiation: Secondary | ICD-10-CM | POA: Diagnosis not present

## 2023-10-13 DIAGNOSIS — L570 Actinic keratosis: Secondary | ICD-10-CM | POA: Diagnosis not present

## 2023-10-13 DIAGNOSIS — L821 Other seborrheic keratosis: Secondary | ICD-10-CM | POA: Diagnosis not present

## 2023-10-13 DIAGNOSIS — L814 Other melanin hyperpigmentation: Secondary | ICD-10-CM | POA: Diagnosis not present

## 2023-10-13 DIAGNOSIS — D1801 Hemangioma of skin and subcutaneous tissue: Secondary | ICD-10-CM | POA: Diagnosis not present

## 2023-10-13 DIAGNOSIS — I781 Nevus, non-neoplastic: Secondary | ICD-10-CM | POA: Diagnosis not present

## 2023-10-13 DIAGNOSIS — Z85828 Personal history of other malignant neoplasm of skin: Secondary | ICD-10-CM | POA: Diagnosis not present

## 2023-10-13 DIAGNOSIS — Z859 Personal history of malignant neoplasm, unspecified: Secondary | ICD-10-CM | POA: Diagnosis not present

## 2023-10-16 DIAGNOSIS — G4733 Obstructive sleep apnea (adult) (pediatric): Secondary | ICD-10-CM | POA: Diagnosis not present

## 2023-10-19 DIAGNOSIS — H35351 Cystoid macular degeneration, right eye: Secondary | ICD-10-CM | POA: Diagnosis not present

## 2023-10-19 DIAGNOSIS — H04123 Dry eye syndrome of bilateral lacrimal glands: Secondary | ICD-10-CM | POA: Diagnosis not present

## 2023-10-19 DIAGNOSIS — Z8669 Personal history of other diseases of the nervous system and sense organs: Secondary | ICD-10-CM | POA: Diagnosis not present

## 2023-10-19 DIAGNOSIS — Z961 Presence of intraocular lens: Secondary | ICD-10-CM | POA: Diagnosis not present

## 2023-10-19 DIAGNOSIS — H35363 Drusen (degenerative) of macula, bilateral: Secondary | ICD-10-CM | POA: Diagnosis not present

## 2023-10-19 DIAGNOSIS — H30033 Focal chorioretinal inflammation, peripheral, bilateral: Secondary | ICD-10-CM | POA: Diagnosis not present

## 2023-10-19 DIAGNOSIS — H33012 Retinal detachment with single break, left eye: Secondary | ICD-10-CM | POA: Diagnosis not present

## 2023-11-16 DIAGNOSIS — G4733 Obstructive sleep apnea (adult) (pediatric): Secondary | ICD-10-CM | POA: Diagnosis not present

## 2023-12-17 DIAGNOSIS — G4733 Obstructive sleep apnea (adult) (pediatric): Secondary | ICD-10-CM | POA: Diagnosis not present

## 2024-01-14 DIAGNOSIS — G4733 Obstructive sleep apnea (adult) (pediatric): Secondary | ICD-10-CM | POA: Diagnosis not present

## 2024-01-28 DIAGNOSIS — L820 Inflamed seborrheic keratosis: Secondary | ICD-10-CM | POA: Diagnosis not present

## 2024-01-28 DIAGNOSIS — L570 Actinic keratosis: Secondary | ICD-10-CM | POA: Diagnosis not present

## 2024-01-28 DIAGNOSIS — D485 Neoplasm of uncertain behavior of skin: Secondary | ICD-10-CM | POA: Diagnosis not present

## 2024-02-14 DIAGNOSIS — G4733 Obstructive sleep apnea (adult) (pediatric): Secondary | ICD-10-CM | POA: Diagnosis not present

## 2024-02-19 DIAGNOSIS — H40043 Steroid responder, bilateral: Secondary | ICD-10-CM | POA: Diagnosis not present

## 2024-02-19 DIAGNOSIS — H209 Unspecified iridocyclitis: Secondary | ICD-10-CM | POA: Diagnosis not present

## 2024-02-19 DIAGNOSIS — H35353 Cystoid macular degeneration, bilateral: Secondary | ICD-10-CM | POA: Diagnosis not present

## 2024-02-19 DIAGNOSIS — Z8669 Personal history of other diseases of the nervous system and sense organs: Secondary | ICD-10-CM | POA: Diagnosis not present

## 2024-03-15 DIAGNOSIS — G4733 Obstructive sleep apnea (adult) (pediatric): Secondary | ICD-10-CM | POA: Diagnosis not present

## 2024-04-15 DIAGNOSIS — G4733 Obstructive sleep apnea (adult) (pediatric): Secondary | ICD-10-CM | POA: Diagnosis not present

## 2024-04-26 DIAGNOSIS — L249 Irritant contact dermatitis, unspecified cause: Secondary | ICD-10-CM | POA: Diagnosis not present

## 2024-04-26 DIAGNOSIS — Z859 Personal history of malignant neoplasm, unspecified: Secondary | ICD-10-CM | POA: Diagnosis not present

## 2024-04-26 DIAGNOSIS — L578 Other skin changes due to chronic exposure to nonionizing radiation: Secondary | ICD-10-CM | POA: Diagnosis not present

## 2024-04-26 DIAGNOSIS — D692 Other nonthrombocytopenic purpura: Secondary | ICD-10-CM | POA: Diagnosis not present

## 2024-04-26 DIAGNOSIS — Z08 Encounter for follow-up examination after completed treatment for malignant neoplasm: Secondary | ICD-10-CM | POA: Diagnosis not present

## 2024-04-26 DIAGNOSIS — L905 Scar conditions and fibrosis of skin: Secondary | ICD-10-CM | POA: Diagnosis not present

## 2024-04-26 DIAGNOSIS — L570 Actinic keratosis: Secondary | ICD-10-CM | POA: Diagnosis not present

## 2024-04-26 DIAGNOSIS — Z85828 Personal history of other malignant neoplasm of skin: Secondary | ICD-10-CM | POA: Diagnosis not present

## 2024-04-26 DIAGNOSIS — I878 Other specified disorders of veins: Secondary | ICD-10-CM | POA: Diagnosis not present

## 2024-05-15 DIAGNOSIS — G4733 Obstructive sleep apnea (adult) (pediatric): Secondary | ICD-10-CM | POA: Diagnosis not present

## 2024-07-02 NOTE — Progress Notes (Unsigned)
  Electrophysiology Office Note:   Date:  07/04/2024  ID:  Lawrence Gomez, DOB 01-May-1946, MRN [id number]  Primary Cardiologist: Lonni LITTIE Nanas, MD Primary Heart Failure: None Electrophysiologist: Yoneko Talerico Gladis Norton, MD      History of Present Illness:   Lawrence Gomez is a 78 y.o. male with h/o SVT, sleep apnea, hyperlipidemia seen today for routine electrophysiology followup.   Since last being seen in our clinic the patient reports continued episodes of SVT.  Over the last few months, his SVT episodes have worsened.  He is having an episode once every few days.  He feels weak, fatigued and lightheaded during his episodes of SVT.  He is less steady on his feet when he is having these episodes..  he denies chest pain, dyspnea, PND, orthopnea, nausea, vomiting, dizziness, syncope, edema, weight gain, or early satiety.   Review of systems complete and found to be negative unless listed in HPI.   EP Information / Studies Reviewed:    EKG is ordered today. Personal review as below.  EKG Interpretation Date/Time:  Monday July 04 2024 11:43:37 EDT Ventricular Rate:  60 PR Interval:  316 QRS Duration:  114 QT Interval:  464 QTC Calculation: 464 R Axis:   -44  Text Interpretation: Sinus rhythm with 1st degree A-V block Left axis deviation Minimal voltage criteria for LVH, may be normal variant ( Cornell product ) When compared with ECG of 01-Jun-2023 09:55, No significant change since last tracing Confirmed by Piero Mustard ([id number]) on 07/04/2024 11:54:14 AM     Risk Assessment/Calculations:           Physical Exam:   VS:  BP 120/68   Pulse 80   Ht 5' 10 (1.778 m)   Wt 187 lb (84.8 kg)   SpO2 97%   BMI 26.83 kg/m    Wt Readings from Last 3 Encounters:  07/04/24 187 lb (84.8 kg)  06/01/23 191 lb (86.6 kg)  09/01/22 187 lb (84.8 kg)     GEN: Well nourished, well developed in no acute distress NECK: No JVD; No carotid bruits CARDIAC: Regular rate and rhythm, no  murmurs, rubs, gallops RESPIRATORY:  Clear to auscultation without rales, wheezing or rhonchi  ABDOMEN: Soft, non-tender, non-distended EXTREMITIES:  No edema; No deformity   ASSESSMENT AND PLAN:    1.  SVT:.  Zio patch showed atrial fibrillation.  Had wide-complex tachycardia terminated with adenosine.  On metoprolol .  He has had more episodes of SVT.  He has had [place] wide and narrow complex tachycardia as noted on his Kardia mobile device.  Over the last few months, his episodes have been more frequent.  Yosselyn Tax switch to Toprol -XL 100 mg daily.  If this does not control his symptoms, he may wish to proceed with ablation.  2.  Syncope: Had syncope while standing.  Was dehydrated on presentation to emergency room.  No further episodes.  Follow up with Dr. Norton in 6 months  Signed, Cici Rodriges Gladis Norton, MD

## 2024-07-04 ENCOUNTER — Ambulatory Visit: Attending: Cardiology | Admitting: Cardiology

## 2024-07-04 ENCOUNTER — Encounter: Payer: Self-pay | Admitting: Cardiology

## 2024-07-04 VITALS — BP 120/68 | HR 80 | Ht 70.0 in | Wt 187.0 lb

## 2024-07-04 DIAGNOSIS — I471 Supraventricular tachycardia, unspecified: Secondary | ICD-10-CM | POA: Diagnosis not present

## 2024-07-04 DIAGNOSIS — R55 Syncope and collapse: Secondary | ICD-10-CM

## 2024-07-04 DIAGNOSIS — R000 Tachycardia, unspecified: Secondary | ICD-10-CM

## 2024-07-04 MED ORDER — METOPROLOL SUCCINATE ER 100 MG PO TB24: 100.0000 mg | ORAL_TABLET | Freq: Every day | ORAL | 3 refills | Status: AC

## 2024-07-04 NOTE — Patient Instructions (Signed)
 Medication Instructions:  Your physician has recommended you make the following change in your medication:    Finish your current metoprolol  prescription.  When it is gone then:  STOP taking metoprolol  tartrate  START taking metoprolol  succinate (Toprol  XL)-Take 100 mg by mouth DAILY at bedtime.  *If you need a refill on your cardiac medications before your next appointment, please call your pharmacy*  Lab Work: None ordered. If you have labs (blood work) drawn today and your tests are completely normal, you will receive your results only by: MyChart Message (if you have MyChart) OR A paper copy in the mail If you have any lab test that is abnormal or we need to change your treatment, we will call you to review the results.  Testing/Procedures: None ordered.  Follow-Up: At [facility], you and your health needs are our priority.  As part of our continuing mission to provide you with exceptional heart care, our providers are all part of one team.  This team includes your primary Cardiologist (physician) and Advanced Practice Providers or APPs (Physician Assistants and Nurse Practitioners) who all work together to provide you with the care you need, when you need it.  Your next appointment:   6 month(s)  Provider:   Soyla Norton, MD   We recommend signing up for the patient portal called MyChart.  Sign up information is provided on this After Visit Summary.  MyChart is used to connect with patients for Virtual Visits (Telemedicine).  Patients are able to view lab/test results, encounter notes, upcoming appointments, etc.  Non-urgent messages can be sent to your provider as well.   To learn more about what you can do with MyChart, go to [email or web address].   Metoprolol  Extended-Release Capsules What is this medication? METOPROLOL  (me TOE proe lole) treats high blood pressure and heart failure. It may also be used to prevent chest pain (angina). It works by lowering your  blood pressure and heart rate, making it easier for your heart to pump blood to the rest of your body. It belongs to a group of medications called beta blockers. This medicine may be used for other purposes; ask your health care provider or pharmacist if you have questions. COMMON BRAND NAME(S): KAPSPARGO  What should I tell my care team before I take this medication? They need to know if you have any of these conditions: Diabetes Heart or blood vessel conditions, such as slow heartbeat, worsening heart failure, heart block, sick sinus syndrome Liver disease Lung or breathing disease, such as asthma or COPD Pheochromocytoma Thyroid  disease An unusual or allergic reaction to metoprolol , other medications, foods, dyes, or preservatives Pregnant or trying to get pregnant Breastfeeding How should I use this medication? Take this medication by mouth with water. Take it as directed on the prescription label at the same time every day. Do not cut, crush, or chew this medication. Swallow the capsules whole. You may open the capsule and put the contents on 1 teaspoon of soft food, such as applesauce, pudding, or yogurt. Swallow the mixture right away. Do not chew the mixture. You can take it with or without food. It is best to take it with food or after a meal. You should always take it the same way. Keep taking it unless your care team tells you to stop. Talk to your care team about the use of this medication in children. While it may be prescribed for children as young as 6 years for selected conditions, precautions do apply. Overdosage: If  you think you have taken too much of this medicine contact a poison control center or emergency room at once. NOTE: This medicine is only for you. Do not share this medicine with others. What if I miss a dose? If you miss a dose, take it as soon as you can. If it is almost time for your next dose, take only that dose. Do not take double or extra doses. What may  interact with this medication? Epinephrine  MAOIs, such as Marplan, Nardil, and Parnate NSAIDS, medications for pain and inflammation, such as ibuprofen or naproxen Some medications for blood pressure, heart disease, irregular heartbeat Other medications may affect the way this medication works. Talk with your care team about all the medications you take. They may suggest changes to your treatment plan to lower the risk of side effects and to make sure your medications work as intended. This list may not describe all possible interactions. Give your health care provider a list of all the medicines, herbs, non-prescription drugs, or dietary supplements you use. Also tell them if you smoke, drink alcohol , or use illegal drugs. Some items may interact with your medicine. What should I watch for while using this medication? Visit your care team for regular checks on your progress. Check your blood pressure as directed. Know what your blood pressure should be and when to contact your care team. Taking this medication is only part of a total heart healthy program. Ask your care team if there are other changes you can make to improve your overall health. Do not treat yourself for coughs, colds, or pain while you are using this medication without asking your care team for advice. Some medications may increase your blood pressure. This medication may affect your coordination, reaction time, or judgment. Do not drive or operate machinery until you know how this medication affects you. Sit up or stand slowly to reduce the risk of dizzy or fainting spells. Drinking alcohol  with this medication can increase the risk of these side effects. This medication may affect blood glucose levels. It can also mask the symptoms of low blood sugar, such as a rapid heartbeat and tremors. If you have diabetes, it is important to check your blood sugar often while you are taking this medication. Do not suddenly stop taking this  medication. This may increase your risk of side effects, such as chest pain and heart attack. If you no longer need to take this medication, your care team will lower the dose slowly over time to decrease the risk of side effects. If you are going to need surgery or a procedure, tell your care team that you are using this medication. What side effects may I notice from receiving this medication? Side effects that you should report to your care team as soon as possible: Allergic reactions--skin rash, itching, hives, swelling of the face, lips, tongue, or throat Heart failure--shortness of breath, swelling of the ankles, feet, or hands, sudden weight gain, unusual weakness or fatigue Low blood pressure--dizziness, feeling faint or lightheaded, blurry vision Raynaud's--cool, numb, or painful fingers or toes that may change color from pale, to blue, to red Slow heartbeat--dizziness, feeling faint or lightheaded, confusion, trouble breathing, unusual weakness or fatigue Worsening mood, feelings of depression Side effects that usually do not require medical attention (report to your care team if they continue or are bothersome): Change in sex drive or performance Diarrhea Dizziness Fatigue Headache This list may not describe all possible side effects. Call your doctor for medical  advice about side effects. You may report side effects to FDA at 1-800-FDA-1088. Where should I keep my medication? Keep out of the reach of children and pets. Store at room temperature between 20 and 25 degrees C (68 and 77 degrees F). Get rid of any unused medication after the expiration date. To get rid of medications that are no longer needed or have expired: Take the medication to a take-back program. Check with your pharmacy or law enforcement to find a location. If you cannot return the medication, check the label or package insert to see if the medication should be thrown out in the garbage or flushed down the  toilet. If you are not sure, ask your care team. If it is safe to put it in the trash, empty the medication out of the container. Mix it with cat litter, dirt, coffee grounds, or another unwanted substance. Seal the mixture in a bag or container. Put it in the trash. NOTE: This sheet is a summary. It may not cover all possible information. If you have questions about this medicine, talk to your doctor, pharmacist, or health care provider.  2025 Elsevier/Gold Standard (2024-02-24 00:00:00)

## 2024-07-30 ENCOUNTER — Encounter (HOSPITAL_BASED_OUTPATIENT_CLINIC_OR_DEPARTMENT_OTHER): Payer: Self-pay

## 2024-07-30 ENCOUNTER — Ambulatory Visit (HOSPITAL_BASED_OUTPATIENT_CLINIC_OR_DEPARTMENT_OTHER)
Admission: EM | Admit: 2024-07-30 | Discharge: 2024-07-30 | Disposition: A | Discharge status: Home / Self Care | Attending: Family Medicine | Admitting: Family Medicine

## 2024-07-30 DIAGNOSIS — L600 Ingrowing nail: Secondary | ICD-10-CM

## 2024-07-30 DIAGNOSIS — L03031 Cellulitis of right toe: Secondary | ICD-10-CM | POA: Diagnosis not present

## 2024-07-30 MED ORDER — CEPHALEXIN 500 MG PO CAPS: 1000.0000 mg | ORAL_CAPSULE | Freq: Two times a day (BID) | ORAL | 0 refills | Status: AC

## 2024-07-30 MED ORDER — MUPIROCIN 2 % EX OINT: 1.0000 | TOPICAL_OINTMENT | Freq: Two times a day (BID) | CUTANEOUS | 0 refills | Status: AC

## 2024-07-30 NOTE — ED Provider Notes (Signed)
 [facility]    CSN: [id number] Arrival date & time: 07/30/24  0900      History   Chief Complaint Chief Complaint  Patient presents with   Nail Problem    HPI Lawrence Gomez is a 78 y.o. male.   78 year old male with complaint of ingrown lateral border of the right great toenail.  He has been having pain, redness and crusting or discharge since approximately 07/03/2024 or earlier.  He feels like he probably clipped his nail to low and it got ingrown.  At times it is really red and swollen and has significant pain.  He has been trying to bubble it with peroxide and clean it aggressively and use Neosporin on it if he is on his feet all day all the toes and his feet are red.  He is concerned that is infected and he is going on a trip in a few weeks and wants to get it better.     Past Medical History:  Diagnosis Date   Aortic atherosclerosis    History of cardiac catheterization    Dr. 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 Authorizing Provider  cephALEXin (KEFLEX) 500 MG capsule Take 2 capsules (1,000 mg total) by mouth 2 (two) times daily for 5 days. 07/30/24 08/04/24 Yes Ival Domino, FNP  citalopram (CELEXA) 20 MG tablet Take 20 mg by mouth daily. 10/22/20  Yes [provider]  dicyclomine (BENTYL) 10 MG capsule Take 20 mg by mouth as directed. As needed 07/27/23  Yes [provider]  ibuprofen (ADVIL) 200 MG tablet Take 400-600 mg by mouth every 8 (eight) hours as needed (pain).   Yes [provider]   metoprolol  succinate (TOPROL -XL) 100 MG 24 hr tablet Take 1 tablet (100 mg total) by mouth daily. Take with or immediately following a meal. 07/04/24 10/02/24 Yes Camnitz, Will Gladis, MD  mupirocin ointment (BACTROBAN) 2 % Apply 1 Application topically 2 (two) times daily. 07/30/24  Yes Ival Domino, FNP  pantoprazole  (PROTONIX ) 40 MG tablet Take 40 mg by mouth daily. 07/26/16  Yes [provider]  pravastatin  (PRAVACHOL ) 20 MG tablet Take 20 mg by mouth every other day.   Yes [provider]    Family History Family History  Problem Relation Age of Onset   Hypertension Father     Social History Social History   Tobacco Use   Smoking status: Former   Smokeless tobacco: Never  Advertising account planner   Vaping status: Never Used  Substance Use Topics   Alcohol  use: Never   Drug use: Never     Allergies   Penicillins   Review of Systems Review of Systems  Constitutional:  Negative for fever.  Respiratory:  Negative for cough.   Cardiovascular:  Negative for chest pain.  Gastrointestinal:  Negative for abdominal pain, constipation, diarrhea, nausea and vomiting.  Musculoskeletal:  Negative for arthralgias and back pain.  Skin:  Positive for wound (Ingrown lateral border of right great toenail with  redness and discharge). Negative for color change and rash.  Neurological:  Negative for syncope.  All other systems reviewed and are negative.    Physical Exam Triage Vital Signs ED Triage Vitals  Encounter Vitals Group     BP 07/30/24 0945 122/72     Girls Systolic BP Percentile --      Girls Diastolic BP Percentile --      Boys Systolic BP Percentile --      Boys Diastolic BP Percentile --      Pulse Rate 07/30/24 0945 (!) 52     Resp 07/30/24 0945 16     Temp 07/30/24 0945 97.9 F (36.6 C)     Temp Source 07/30/24 0945 Oral     SpO2 07/30/24 0945 97 %     Weight 07/30/24 0944 183 lb (83 kg)     Height 07/30/24 0944 5' 11 (1.803 m)     Head Circumference --       Peak Flow --      Pain Score 07/30/24 0944 7     Pain Loc --      Pain Education --      Exclude from Growth Chart --    No data found.  Updated Vital Signs BP 122/72 (BP Location: Right Arm)   Pulse (!) 52   Temp 97.9 F (36.6 C) (Oral)   Resp 16   Ht 5' 11 (1.803 m)   Wt 183 lb (83 kg)   SpO2 97%   BMI 25.52 kg/m   Visual Acuity Right Eye Distance:   Left Eye Distance:   Bilateral Distance:    Right Eye Near:   Left Eye Near:    Bilateral Near:     Physical Exam Vitals and nursing note reviewed.  Constitutional:      General: He is not in acute distress.    Appearance: He is well-developed. He is not ill-appearing or toxic-appearing.  HENT:     Head: Normocephalic and atraumatic.     Right Ear: External ear normal.     Left Ear: External ear normal.     Nose: Nose normal.     Mouth/Throat:     Lips: Pink.     Mouth: Mucous membranes are moist.  Eyes:     Conjunctiva/sclera: Conjunctivae normal.     Pupils: Pupils are equal, round, and reactive to light.  Cardiovascular:     Rate and Rhythm: Normal rate and regular rhythm.     Pulses:          Dorsalis pedis pulses are 2+ on the right side and 2+ on the left side.       Posterior tibial pulses are 2+ on the right side and 2+ on the left side.     Heart sounds: S1 normal and S2 normal. No murmur heard. Pulmonary:     Effort: Pulmonary effort is normal. No respiratory distress.     Breath sounds: Normal breath sounds. No decreased breath sounds, wheezing, rhonchi or rales.  Musculoskeletal:        General: No swelling.  Feet:     Right foot:     Skin integrity: Skin integrity normal.     Toenail Condition: Right toenails are ingrown.     Left foot:     Skin integrity: Skin integrity normal.     Comments: Right great toenail lateral border is ingrown with minimal erythema and some dry crusting.  See photo for more information. Skin:  General: Skin is warm and dry.     Capillary Refill: Capillary  refill takes less than 2 seconds.     Findings: No rash.  Neurological:     Mental Status: He is alert and oriented to person, place, and time.  Psychiatric:        Mood and Affect: Mood normal.      UC Treatments / Results  Labs (all labs ordered are listed, but only abnormal results are displayed) Labs Reviewed - No data to display  EKG   Radiology No results found.  Procedures Excise Ingrown Toenail  Date/Time: 07/30/2024 10:25 AM  Performed by: Ival Domino, FNP Authorized by: Ival Domino, FNP   Consent:    Consent obtained:  Verbal   Consent given by:  Patient   Risks, benefits, and alternatives were discussed: yes     Risks discussed:  Bleeding, incomplete removal, infection, pain and permanent nail deformity   Alternatives discussed:  Referral, observation, alternative treatment, delayed treatment and no treatment Universal protocol:    Procedure explained and questions answered to patient or proxy's satisfaction: yes     Immediately prior to procedure, a time out was called: yes     Patient identity confirmed:  Verbally with patient Pre-procedure details:    Skin preparation:  Chlorhexidine Procedure details:    Location:  Foot   Foot location:  R big toe Anesthesia:    Anesthesia method: Ethyl chloride spray. Nail Removal:    Nail removed:  Partial   Nail side:  Lateral   Nail bed repaired: no     Removed nail replaced and anchored: no   Trephination:    Subungual hematoma drained: no   Ingrown nail:    Wedge excision of skin: yes     Nail matrix removed or ablated:  None Post-procedure details:    Dressing:  4x4 sterile gauze, antibiotic ointment and gauze roll   Procedure completion:  Tolerated well, no immediate complications  (including critical care time)  Medications Ordered in UC Medications - No data to display  Initial Impression / Assessment and Plan / UC Course  I have reviewed the triage vital signs and the nursing  notes.  Pertinent labs & imaging results that were available during my care of the patient were reviewed by me and considered in my medical decision making (see chart for details).  Plan of Care: Ingrown right great toenail with cellulitis: Leave the current bandage on until midday tomorrow.  Do not soak the foot in any kind of foot soak, tub, bath, pool, pond, lake, ocean.  Clean the wound with warm soapy fingers, rinse, pat dry, apply mupirocin ointment and a bandage.  Clean the wound at least once daily but may do twice daily.  Cephalexin, 500 mg, 2 pills twice daily for 5 days.  Get plenty of fluids and rest.  Follow-up if symptoms do not improve, worsen or new symptoms occur.  See discharge instructions for signs of worsening condition and reasons to go to an emergency room or return here.  I reviewed the plan of care with the patient and/or the patient's guardian.  The patient and/or guardian had time to ask questions and acknowledged that the questions were answered.  I provided instruction on symptoms or reasons to return here or to go to an ER, if symptoms/condition did not improve, worsened or if new symptoms occurred.  Final Clinical Impressions(s) / UC Diagnoses   Final diagnoses:  Ingrown right greater toenail  Cellulitis of  great toe of right foot     Discharge Instructions      Ingrown right great toenail with cellulitis: Leave the current bandage on until midday tomorrow.  Do not soak the foot in any kind of foot soak, tub, bath, pool, pond, lake, ocean.  Clean the wound with warm soapy fingers, rinse, pat dry, apply mupirocin ointment and a bandage.  Clean the wound at least once daily but may do twice daily.  Cephalexin, 500 mg, 2 pills twice daily for 5 days.  Get plenty of fluids and rest.  Follow-up if symptoms do not improve, worsen or new symptoms occur.  See below for signs of worsening condition and reasons to go to an emergency room or return here.  Contact a  health care provider if: You have a fever. Your symptoms do not improve within 1-2 days of starting treatment or you develop new symptoms. Your bone or joint underneath the infected area becomes painful after the skin has healed. Your infection returns in the same area or another area. Signs of this may include: You notice a swollen bump in the infected area. Your red area gets larger, turns dark in color, or becomes more painful. Drainage increases. Pus or a bad smell develops in your infected area. You have more pain. You feel ill and have muscle aches and weakness. You develop vomiting or diarrhea that will not go away. Get help right away if: You notice red streaks coming from the infected area. You notice the skin turns purple or black and falls off. This symptom may be an emergency. Get help right away. Call 911. Do not wait to see if the symptom will go away. Do not drive yourself to the hospital.     ED Prescriptions     Medication Sig Dispense Auth. Provider   mupirocin ointment (BACTROBAN) 2 % Apply 1 Application topically 2 (two) times daily. 22 g Ival Domino, FNP   cephALEXin (KEFLEX) 500 MG capsule Take 2 capsules (1,000 mg total) by mouth 2 (two) times daily for 5 days. 20 capsule Ival Domino, FNP      PDMP not reviewed this encounter.   Ival Domino, FNP 07/30/24 1026

## 2024-07-30 NOTE — ED Triage Notes (Signed)
 Pt states that he has some pain and swelling of his right great toe. X2-3 weeks  Pt states that the pain affects the way that he walks at times.

## 2024-07-30 NOTE — Discharge Instructions (Addendum)
 Ingrown right great toenail with cellulitis: Leave the current bandage on until midday tomorrow.  Do not soak the foot in any kind of foot soak, tub, bath, pool, pond, lake, ocean.  Clean the wound with warm soapy fingers, rinse, pat dry, apply mupirocin ointment and a bandage.  Clean the wound at least once daily but may do twice daily.  Cephalexin, 500 mg, 2 pills twice daily for 5 days.  Get plenty of fluids and rest.  Follow-up if symptoms do not improve, worsen or new symptoms occur.  See below for signs of worsening condition and reasons to go to an emergency room or return here.  Contact a health care provider if: You have a fever. Your symptoms do not improve within 1-2 days of starting treatment or you develop new symptoms. Your bone or joint underneath the infected area becomes painful after the skin has healed. Your infection returns in the same area or another area. Signs of this may include: You notice a swollen bump in the infected area. Your red area gets larger, turns dark in color, or becomes more painful. Drainage increases. Pus or a bad smell develops in your infected area. You have more pain. You feel ill and have muscle aches and weakness. You develop vomiting or diarrhea that will not go away. Get help right away if: You notice red streaks coming from the infected area. You notice the skin turns purple or black and falls off. This symptom may be an emergency. Get help right away. Call 911. Do not wait to see if the symptom will go away. Do not drive yourself to the hospital.

## 2024-08-30 DIAGNOSIS — H35351 Cystoid macular degeneration, right eye: Secondary | ICD-10-CM | POA: Diagnosis not present

## 2024-08-30 DIAGNOSIS — H35363 Drusen (degenerative) of macula, bilateral: Secondary | ICD-10-CM | POA: Diagnosis not present

## 2024-08-30 DIAGNOSIS — Z961 Presence of intraocular lens: Secondary | ICD-10-CM | POA: Diagnosis not present

## 2024-08-30 DIAGNOSIS — H04123 Dry eye syndrome of bilateral lacrimal glands: Secondary | ICD-10-CM | POA: Diagnosis not present

## 2024-08-30 DIAGNOSIS — H30033 Focal chorioretinal inflammation, peripheral, bilateral: Secondary | ICD-10-CM | POA: Diagnosis not present

## 2024-08-30 DIAGNOSIS — Z8669 Personal history of other diseases of the nervous system and sense organs: Secondary | ICD-10-CM | POA: Diagnosis not present

## 2024-08-30 DIAGNOSIS — H33012 Retinal detachment with single break, left eye: Secondary | ICD-10-CM | POA: Diagnosis not present

## 2024-08-30 DIAGNOSIS — H35063 Retinal vasculitis, bilateral: Secondary | ICD-10-CM | POA: Diagnosis not present

## 2024-08-30 DIAGNOSIS — Z79899 Other long term (current) drug therapy: Secondary | ICD-10-CM | POA: Diagnosis not present

## 2024-09-07 DIAGNOSIS — G4733 Obstructive sleep apnea (adult) (pediatric): Secondary | ICD-10-CM | POA: Diagnosis not present
